# Patient Record
Sex: Female | Born: 1960 | Race: White | Hispanic: No | Marital: Married | Smoking: Never smoker
Health system: Southern US, Community
[De-identification: ages and names within clinical notes are randomized; demographics above are authoritative.]

## PROBLEM LIST (undated history)

## (undated) DIAGNOSIS — M810 Age-related osteoporosis without current pathological fracture: Secondary | ICD-10-CM

---

## 1998-01-11 ENCOUNTER — Inpatient Hospital Stay (HOSPITAL_COMMUNITY): Admission: AD | Admit: 1998-01-11 | Discharge: 1998-01-13 | Payer: Self-pay | Admitting: *Deleted

## 1998-01-13 ENCOUNTER — Encounter: Admission: RE | Admit: 1998-01-13 | Discharge: 1998-04-13 | Payer: Self-pay | Admitting: Obstetrics & Gynecology

## 1998-02-22 ENCOUNTER — Other Ambulatory Visit: Admission: RE | Admit: 1998-02-22 | Discharge: 1998-02-22 | Payer: Self-pay | Admitting: Obstetrics & Gynecology

## 2001-11-05 ENCOUNTER — Encounter: Admission: RE | Admit: 2001-11-05 | Discharge: 2001-11-05 | Payer: Self-pay | Admitting: Internal Medicine

## 2001-11-05 ENCOUNTER — Encounter: Payer: Self-pay | Admitting: Internal Medicine

## 2002-01-14 ENCOUNTER — Encounter: Admission: RE | Admit: 2002-01-14 | Discharge: 2002-01-14 | Payer: Self-pay | Admitting: Internal Medicine

## 2002-01-14 ENCOUNTER — Encounter: Payer: Self-pay | Admitting: Internal Medicine

## 2002-06-24 ENCOUNTER — Ambulatory Visit (HOSPITAL_COMMUNITY): Admission: RE | Admit: 2002-06-24 | Discharge: 2002-06-24 | Payer: Self-pay | Admitting: *Deleted

## 2003-05-18 ENCOUNTER — Other Ambulatory Visit: Admission: RE | Admit: 2003-05-18 | Discharge: 2003-05-18 | Payer: Self-pay | Admitting: Obstetrics and Gynecology

## 2003-06-28 ENCOUNTER — Encounter: Admission: RE | Admit: 2003-06-28 | Discharge: 2003-06-28 | Payer: Self-pay | Admitting: Internal Medicine

## 2004-05-24 ENCOUNTER — Other Ambulatory Visit: Admission: RE | Admit: 2004-05-24 | Discharge: 2004-05-24 | Payer: Self-pay | Admitting: Obstetrics and Gynecology

## 2004-11-13 ENCOUNTER — Emergency Department (HOSPITAL_COMMUNITY): Admission: EM | Admit: 2004-11-13 | Discharge: 2004-11-13 | Payer: Self-pay | Admitting: Emergency Medicine

## 2010-08-03 ENCOUNTER — Other Ambulatory Visit
Admission: RE | Admit: 2010-08-03 | Discharge: 2010-08-03 | Payer: Self-pay | Source: Home / Self Care | Admitting: Internal Medicine

## 2011-11-12 ENCOUNTER — Other Ambulatory Visit: Payer: Self-pay | Admitting: Obstetrics and Gynecology

## 2014-06-22 ENCOUNTER — Other Ambulatory Visit: Payer: Self-pay | Admitting: Family

## 2014-06-22 DIAGNOSIS — Z8249 Family history of ischemic heart disease and other diseases of the circulatory system: Secondary | ICD-10-CM

## 2014-07-01 ENCOUNTER — Ambulatory Visit
Admission: RE | Admit: 2014-07-01 | Discharge: 2014-07-01 | Disposition: A | Discharge status: Home / Self Care | Payer: BC Managed Care – PPO | Source: Ambulatory Visit | Attending: Family | Admitting: Family

## 2014-07-01 DIAGNOSIS — Z8249 Family history of ischemic heart disease and other diseases of the circulatory system: Secondary | ICD-10-CM

## 2015-03-16 ENCOUNTER — Other Ambulatory Visit: Payer: Self-pay | Admitting: Obstetrics and Gynecology

## 2015-03-17 LAB — CYTOLOGY - PAP

## 2015-07-05 ENCOUNTER — Other Ambulatory Visit: Payer: Self-pay | Admitting: Family

## 2015-07-05 DIAGNOSIS — E2839 Other primary ovarian failure: Secondary | ICD-10-CM

## 2015-08-09 ENCOUNTER — Ambulatory Visit
Admission: RE | Admit: 2015-08-09 | Discharge: 2015-08-09 | Disposition: A | Discharge status: Home / Self Care | Payer: BLUE CROSS/BLUE SHIELD | Source: Ambulatory Visit | Attending: Family | Admitting: Family

## 2015-08-09 DIAGNOSIS — E2839 Other primary ovarian failure: Secondary | ICD-10-CM

## 2016-06-28 DIAGNOSIS — L821 Other seborrheic keratosis: Secondary | ICD-10-CM | POA: Diagnosis not present

## 2016-06-28 DIAGNOSIS — L570 Actinic keratosis: Secondary | ICD-10-CM | POA: Diagnosis not present

## 2016-06-28 DIAGNOSIS — D2239 Melanocytic nevi of other parts of face: Secondary | ICD-10-CM | POA: Diagnosis not present

## 2016-06-28 DIAGNOSIS — D485 Neoplasm of uncertain behavior of skin: Secondary | ICD-10-CM | POA: Diagnosis not present

## 2016-07-18 DIAGNOSIS — E7800 Pure hypercholesterolemia, unspecified: Secondary | ICD-10-CM | POA: Diagnosis not present

## 2016-07-18 DIAGNOSIS — E559 Vitamin D deficiency, unspecified: Secondary | ICD-10-CM | POA: Diagnosis not present

## 2016-07-18 DIAGNOSIS — Z0000 Encounter for general adult medical examination without abnormal findings: Secondary | ICD-10-CM | POA: Diagnosis not present

## 2016-08-07 ENCOUNTER — Other Ambulatory Visit: Payer: Self-pay | Admitting: Obstetrics and Gynecology

## 2016-08-07 DIAGNOSIS — Z01419 Encounter for gynecological examination (general) (routine) without abnormal findings: Secondary | ICD-10-CM | POA: Diagnosis not present

## 2016-08-07 DIAGNOSIS — Z124 Encounter for screening for malignant neoplasm of cervix: Secondary | ICD-10-CM | POA: Diagnosis not present

## 2016-08-07 DIAGNOSIS — Z1231 Encounter for screening mammogram for malignant neoplasm of breast: Secondary | ICD-10-CM | POA: Diagnosis not present

## 2016-08-07 DIAGNOSIS — Z6821 Body mass index (BMI) 21.0-21.9, adult: Secondary | ICD-10-CM | POA: Diagnosis not present

## 2016-08-08 LAB — CYTOLOGY - PAP

## 2017-02-19 DIAGNOSIS — R399 Unspecified symptoms and signs involving the genitourinary system: Secondary | ICD-10-CM | POA: Diagnosis not present

## 2017-06-26 DIAGNOSIS — L570 Actinic keratosis: Secondary | ICD-10-CM | POA: Diagnosis not present

## 2017-06-26 DIAGNOSIS — L821 Other seborrheic keratosis: Secondary | ICD-10-CM | POA: Diagnosis not present

## 2017-07-24 DIAGNOSIS — H5210 Myopia, unspecified eye: Secondary | ICD-10-CM | POA: Diagnosis not present

## 2017-07-24 DIAGNOSIS — H2513 Age-related nuclear cataract, bilateral: Secondary | ICD-10-CM | POA: Diagnosis not present

## 2017-07-31 DIAGNOSIS — Z13820 Encounter for screening for osteoporosis: Secondary | ICD-10-CM | POA: Diagnosis not present

## 2017-07-31 DIAGNOSIS — E7800 Pure hypercholesterolemia, unspecified: Secondary | ICD-10-CM | POA: Diagnosis not present

## 2017-07-31 DIAGNOSIS — Z0000 Encounter for general adult medical examination without abnormal findings: Secondary | ICD-10-CM | POA: Diagnosis not present

## 2017-07-31 DIAGNOSIS — E559 Vitamin D deficiency, unspecified: Secondary | ICD-10-CM | POA: Diagnosis not present

## 2017-08-02 ENCOUNTER — Other Ambulatory Visit: Payer: Self-pay | Admitting: Family Medicine

## 2017-08-02 DIAGNOSIS — R5381 Other malaise: Secondary | ICD-10-CM

## 2017-08-23 DIAGNOSIS — R350 Frequency of micturition: Secondary | ICD-10-CM | POA: Diagnosis not present

## 2017-10-08 ENCOUNTER — Other Ambulatory Visit: Payer: Self-pay | Admitting: Family Medicine

## 2017-10-08 DIAGNOSIS — M8580 Other specified disorders of bone density and structure, unspecified site: Secondary | ICD-10-CM

## 2017-10-29 ENCOUNTER — Ambulatory Visit
Admission: RE | Admit: 2017-10-29 | Discharge: 2017-10-29 | Disposition: A | Discharge status: Home / Self Care | Payer: BLUE CROSS/BLUE SHIELD | Source: Ambulatory Visit | Attending: Family Medicine | Admitting: Family Medicine

## 2017-10-29 DIAGNOSIS — M8589 Other specified disorders of bone density and structure, multiple sites: Secondary | ICD-10-CM | POA: Diagnosis not present

## 2017-10-29 DIAGNOSIS — Z780 Asymptomatic menopausal state: Secondary | ICD-10-CM | POA: Diagnosis not present

## 2017-10-29 DIAGNOSIS — M8580 Other specified disorders of bone density and structure, unspecified site: Secondary | ICD-10-CM

## 2017-12-05 DIAGNOSIS — Z1231 Encounter for screening mammogram for malignant neoplasm of breast: Secondary | ICD-10-CM | POA: Diagnosis not present

## 2017-12-05 DIAGNOSIS — Z124 Encounter for screening for malignant neoplasm of cervix: Secondary | ICD-10-CM | POA: Diagnosis not present

## 2017-12-05 DIAGNOSIS — Z6821 Body mass index (BMI) 21.0-21.9, adult: Secondary | ICD-10-CM | POA: Diagnosis not present

## 2017-12-05 DIAGNOSIS — Z01419 Encounter for gynecological examination (general) (routine) without abnormal findings: Secondary | ICD-10-CM | POA: Diagnosis not present

## 2018-07-17 DIAGNOSIS — L570 Actinic keratosis: Secondary | ICD-10-CM | POA: Diagnosis not present

## 2018-09-04 DIAGNOSIS — Z0000 Encounter for general adult medical examination without abnormal findings: Secondary | ICD-10-CM | POA: Diagnosis not present

## 2018-09-04 DIAGNOSIS — E7800 Pure hypercholesterolemia, unspecified: Secondary | ICD-10-CM | POA: Diagnosis not present

## 2018-09-04 DIAGNOSIS — Z1159 Encounter for screening for other viral diseases: Secondary | ICD-10-CM | POA: Diagnosis not present

## 2018-09-04 DIAGNOSIS — Z1211 Encounter for screening for malignant neoplasm of colon: Secondary | ICD-10-CM | POA: Diagnosis not present

## 2018-09-04 DIAGNOSIS — E559 Vitamin D deficiency, unspecified: Secondary | ICD-10-CM | POA: Diagnosis not present

## 2018-09-29 DIAGNOSIS — M9904 Segmental and somatic dysfunction of sacral region: Secondary | ICD-10-CM | POA: Diagnosis not present

## 2018-09-29 DIAGNOSIS — M62830 Muscle spasm of back: Secondary | ICD-10-CM | POA: Diagnosis not present

## 2018-09-29 DIAGNOSIS — M9903 Segmental and somatic dysfunction of lumbar region: Secondary | ICD-10-CM | POA: Diagnosis not present

## 2018-09-29 DIAGNOSIS — M545 Low back pain: Secondary | ICD-10-CM | POA: Diagnosis not present

## 2018-09-30 DIAGNOSIS — M62830 Muscle spasm of back: Secondary | ICD-10-CM | POA: Diagnosis not present

## 2018-09-30 DIAGNOSIS — M545 Low back pain: Secondary | ICD-10-CM | POA: Diagnosis not present

## 2018-09-30 DIAGNOSIS — M9904 Segmental and somatic dysfunction of sacral region: Secondary | ICD-10-CM | POA: Diagnosis not present

## 2018-09-30 DIAGNOSIS — M9903 Segmental and somatic dysfunction of lumbar region: Secondary | ICD-10-CM | POA: Diagnosis not present

## 2018-10-01 DIAGNOSIS — M545 Low back pain: Secondary | ICD-10-CM | POA: Diagnosis not present

## 2018-10-01 DIAGNOSIS — M9904 Segmental and somatic dysfunction of sacral region: Secondary | ICD-10-CM | POA: Diagnosis not present

## 2018-10-01 DIAGNOSIS — M9903 Segmental and somatic dysfunction of lumbar region: Secondary | ICD-10-CM | POA: Diagnosis not present

## 2018-10-01 DIAGNOSIS — M62830 Muscle spasm of back: Secondary | ICD-10-CM | POA: Diagnosis not present

## 2018-10-06 DIAGNOSIS — M62830 Muscle spasm of back: Secondary | ICD-10-CM | POA: Diagnosis not present

## 2018-10-06 DIAGNOSIS — M545 Low back pain: Secondary | ICD-10-CM | POA: Diagnosis not present

## 2018-10-06 DIAGNOSIS — M9904 Segmental and somatic dysfunction of sacral region: Secondary | ICD-10-CM | POA: Diagnosis not present

## 2018-10-06 DIAGNOSIS — M9903 Segmental and somatic dysfunction of lumbar region: Secondary | ICD-10-CM | POA: Diagnosis not present

## 2018-10-08 DIAGNOSIS — M545 Low back pain: Secondary | ICD-10-CM | POA: Diagnosis not present

## 2018-10-08 DIAGNOSIS — M9904 Segmental and somatic dysfunction of sacral region: Secondary | ICD-10-CM | POA: Diagnosis not present

## 2018-10-08 DIAGNOSIS — M62830 Muscle spasm of back: Secondary | ICD-10-CM | POA: Diagnosis not present

## 2018-10-08 DIAGNOSIS — M9903 Segmental and somatic dysfunction of lumbar region: Secondary | ICD-10-CM | POA: Diagnosis not present

## 2018-10-13 DIAGNOSIS — M9903 Segmental and somatic dysfunction of lumbar region: Secondary | ICD-10-CM | POA: Diagnosis not present

## 2018-10-13 DIAGNOSIS — M62830 Muscle spasm of back: Secondary | ICD-10-CM | POA: Diagnosis not present

## 2018-10-13 DIAGNOSIS — M9904 Segmental and somatic dysfunction of sacral region: Secondary | ICD-10-CM | POA: Diagnosis not present

## 2018-10-13 DIAGNOSIS — M545 Low back pain: Secondary | ICD-10-CM | POA: Diagnosis not present

## 2018-10-15 DIAGNOSIS — M9904 Segmental and somatic dysfunction of sacral region: Secondary | ICD-10-CM | POA: Diagnosis not present

## 2018-10-15 DIAGNOSIS — M62830 Muscle spasm of back: Secondary | ICD-10-CM | POA: Diagnosis not present

## 2018-10-15 DIAGNOSIS — M9903 Segmental and somatic dysfunction of lumbar region: Secondary | ICD-10-CM | POA: Diagnosis not present

## 2018-10-15 DIAGNOSIS — M545 Low back pain: Secondary | ICD-10-CM | POA: Diagnosis not present

## 2018-10-20 DIAGNOSIS — M9903 Segmental and somatic dysfunction of lumbar region: Secondary | ICD-10-CM | POA: Diagnosis not present

## 2018-10-20 DIAGNOSIS — M545 Low back pain: Secondary | ICD-10-CM | POA: Diagnosis not present

## 2018-10-20 DIAGNOSIS — M9904 Segmental and somatic dysfunction of sacral region: Secondary | ICD-10-CM | POA: Diagnosis not present

## 2018-10-20 DIAGNOSIS — M62830 Muscle spasm of back: Secondary | ICD-10-CM | POA: Diagnosis not present

## 2018-10-22 DIAGNOSIS — M9903 Segmental and somatic dysfunction of lumbar region: Secondary | ICD-10-CM | POA: Diagnosis not present

## 2018-10-22 DIAGNOSIS — M545 Low back pain: Secondary | ICD-10-CM | POA: Diagnosis not present

## 2018-10-22 DIAGNOSIS — M9904 Segmental and somatic dysfunction of sacral region: Secondary | ICD-10-CM | POA: Diagnosis not present

## 2018-10-22 DIAGNOSIS — M62830 Muscle spasm of back: Secondary | ICD-10-CM | POA: Diagnosis not present

## 2018-10-27 DIAGNOSIS — M9904 Segmental and somatic dysfunction of sacral region: Secondary | ICD-10-CM | POA: Diagnosis not present

## 2018-10-27 DIAGNOSIS — M9903 Segmental and somatic dysfunction of lumbar region: Secondary | ICD-10-CM | POA: Diagnosis not present

## 2018-10-27 DIAGNOSIS — M62830 Muscle spasm of back: Secondary | ICD-10-CM | POA: Diagnosis not present

## 2018-10-27 DIAGNOSIS — M545 Low back pain: Secondary | ICD-10-CM | POA: Diagnosis not present

## 2018-10-30 DIAGNOSIS — M9904 Segmental and somatic dysfunction of sacral region: Secondary | ICD-10-CM | POA: Diagnosis not present

## 2018-10-30 DIAGNOSIS — M545 Low back pain: Secondary | ICD-10-CM | POA: Diagnosis not present

## 2018-10-30 DIAGNOSIS — M9903 Segmental and somatic dysfunction of lumbar region: Secondary | ICD-10-CM | POA: Diagnosis not present

## 2018-10-30 DIAGNOSIS — M62830 Muscle spasm of back: Secondary | ICD-10-CM | POA: Diagnosis not present

## 2018-11-03 DIAGNOSIS — M545 Low back pain: Secondary | ICD-10-CM | POA: Diagnosis not present

## 2018-11-03 DIAGNOSIS — M9904 Segmental and somatic dysfunction of sacral region: Secondary | ICD-10-CM | POA: Diagnosis not present

## 2018-11-03 DIAGNOSIS — M9903 Segmental and somatic dysfunction of lumbar region: Secondary | ICD-10-CM | POA: Diagnosis not present

## 2018-11-03 DIAGNOSIS — M62830 Muscle spasm of back: Secondary | ICD-10-CM | POA: Diagnosis not present

## 2018-11-04 DIAGNOSIS — Z578 Occupational exposure to other risk factors: Secondary | ICD-10-CM | POA: Diagnosis not present

## 2018-11-05 DIAGNOSIS — H43391 Other vitreous opacities, right eye: Secondary | ICD-10-CM | POA: Diagnosis not present

## 2018-11-10 DIAGNOSIS — M9903 Segmental and somatic dysfunction of lumbar region: Secondary | ICD-10-CM | POA: Diagnosis not present

## 2018-11-10 DIAGNOSIS — M62830 Muscle spasm of back: Secondary | ICD-10-CM | POA: Diagnosis not present

## 2018-11-10 DIAGNOSIS — M9904 Segmental and somatic dysfunction of sacral region: Secondary | ICD-10-CM | POA: Diagnosis not present

## 2018-11-10 DIAGNOSIS — M545 Low back pain: Secondary | ICD-10-CM | POA: Diagnosis not present

## 2018-11-17 DIAGNOSIS — M62830 Muscle spasm of back: Secondary | ICD-10-CM | POA: Diagnosis not present

## 2018-11-17 DIAGNOSIS — M545 Low back pain: Secondary | ICD-10-CM | POA: Diagnosis not present

## 2018-11-17 DIAGNOSIS — M9903 Segmental and somatic dysfunction of lumbar region: Secondary | ICD-10-CM | POA: Diagnosis not present

## 2018-11-17 DIAGNOSIS — M9904 Segmental and somatic dysfunction of sacral region: Secondary | ICD-10-CM | POA: Diagnosis not present

## 2018-12-01 DIAGNOSIS — M9904 Segmental and somatic dysfunction of sacral region: Secondary | ICD-10-CM | POA: Diagnosis not present

## 2018-12-01 DIAGNOSIS — M62830 Muscle spasm of back: Secondary | ICD-10-CM | POA: Diagnosis not present

## 2018-12-01 DIAGNOSIS — M545 Low back pain: Secondary | ICD-10-CM | POA: Diagnosis not present

## 2018-12-01 DIAGNOSIS — M9903 Segmental and somatic dysfunction of lumbar region: Secondary | ICD-10-CM | POA: Diagnosis not present

## 2018-12-09 DIAGNOSIS — E7800 Pure hypercholesterolemia, unspecified: Secondary | ICD-10-CM | POA: Diagnosis not present

## 2018-12-15 DIAGNOSIS — M62830 Muscle spasm of back: Secondary | ICD-10-CM | POA: Diagnosis not present

## 2018-12-15 DIAGNOSIS — M9904 Segmental and somatic dysfunction of sacral region: Secondary | ICD-10-CM | POA: Diagnosis not present

## 2018-12-15 DIAGNOSIS — M9903 Segmental and somatic dysfunction of lumbar region: Secondary | ICD-10-CM | POA: Diagnosis not present

## 2018-12-15 DIAGNOSIS — M545 Low back pain: Secondary | ICD-10-CM | POA: Diagnosis not present

## 2018-12-29 DIAGNOSIS — M545 Low back pain: Secondary | ICD-10-CM | POA: Diagnosis not present

## 2018-12-29 DIAGNOSIS — M9904 Segmental and somatic dysfunction of sacral region: Secondary | ICD-10-CM | POA: Diagnosis not present

## 2018-12-29 DIAGNOSIS — M62830 Muscle spasm of back: Secondary | ICD-10-CM | POA: Diagnosis not present

## 2018-12-29 DIAGNOSIS — M9903 Segmental and somatic dysfunction of lumbar region: Secondary | ICD-10-CM | POA: Diagnosis not present

## 2019-01-20 DIAGNOSIS — M9904 Segmental and somatic dysfunction of sacral region: Secondary | ICD-10-CM | POA: Diagnosis not present

## 2019-01-20 DIAGNOSIS — M62830 Muscle spasm of back: Secondary | ICD-10-CM | POA: Diagnosis not present

## 2019-01-20 DIAGNOSIS — M545 Low back pain: Secondary | ICD-10-CM | POA: Diagnosis not present

## 2019-01-20 DIAGNOSIS — M9903 Segmental and somatic dysfunction of lumbar region: Secondary | ICD-10-CM | POA: Diagnosis not present

## 2019-02-25 DIAGNOSIS — M9904 Segmental and somatic dysfunction of sacral region: Secondary | ICD-10-CM | POA: Diagnosis not present

## 2019-02-25 DIAGNOSIS — M62830 Muscle spasm of back: Secondary | ICD-10-CM | POA: Diagnosis not present

## 2019-02-25 DIAGNOSIS — M545 Low back pain: Secondary | ICD-10-CM | POA: Diagnosis not present

## 2019-02-25 DIAGNOSIS — M9903 Segmental and somatic dysfunction of lumbar region: Secondary | ICD-10-CM | POA: Diagnosis not present

## 2019-03-24 DIAGNOSIS — L578 Other skin changes due to chronic exposure to nonionizing radiation: Secondary | ICD-10-CM | POA: Diagnosis not present

## 2019-03-24 DIAGNOSIS — L821 Other seborrheic keratosis: Secondary | ICD-10-CM | POA: Diagnosis not present

## 2019-03-24 DIAGNOSIS — Z85828 Personal history of other malignant neoplasm of skin: Secondary | ICD-10-CM | POA: Diagnosis not present

## 2019-03-24 DIAGNOSIS — D225 Melanocytic nevi of trunk: Secondary | ICD-10-CM | POA: Diagnosis not present

## 2019-03-25 DIAGNOSIS — M62830 Muscle spasm of back: Secondary | ICD-10-CM | POA: Diagnosis not present

## 2019-03-25 DIAGNOSIS — M545 Low back pain: Secondary | ICD-10-CM | POA: Diagnosis not present

## 2019-03-25 DIAGNOSIS — M9904 Segmental and somatic dysfunction of sacral region: Secondary | ICD-10-CM | POA: Diagnosis not present

## 2019-03-25 DIAGNOSIS — M9903 Segmental and somatic dysfunction of lumbar region: Secondary | ICD-10-CM | POA: Diagnosis not present

## 2019-04-10 DIAGNOSIS — Z1231 Encounter for screening mammogram for malignant neoplasm of breast: Secondary | ICD-10-CM | POA: Diagnosis not present

## 2019-04-10 DIAGNOSIS — Z01419 Encounter for gynecological examination (general) (routine) without abnormal findings: Secondary | ICD-10-CM | POA: Diagnosis not present

## 2019-04-10 DIAGNOSIS — Z6821 Body mass index (BMI) 21.0-21.9, adult: Secondary | ICD-10-CM | POA: Diagnosis not present

## 2019-04-10 DIAGNOSIS — Z124 Encounter for screening for malignant neoplasm of cervix: Secondary | ICD-10-CM | POA: Diagnosis not present

## 2019-04-22 DIAGNOSIS — M62830 Muscle spasm of back: Secondary | ICD-10-CM | POA: Diagnosis not present

## 2019-04-22 DIAGNOSIS — M9904 Segmental and somatic dysfunction of sacral region: Secondary | ICD-10-CM | POA: Diagnosis not present

## 2019-04-22 DIAGNOSIS — M9903 Segmental and somatic dysfunction of lumbar region: Secondary | ICD-10-CM | POA: Diagnosis not present

## 2019-04-22 DIAGNOSIS — M545 Low back pain: Secondary | ICD-10-CM | POA: Diagnosis not present

## 2019-05-20 DIAGNOSIS — M9904 Segmental and somatic dysfunction of sacral region: Secondary | ICD-10-CM | POA: Diagnosis not present

## 2019-05-20 DIAGNOSIS — M62830 Muscle spasm of back: Secondary | ICD-10-CM | POA: Diagnosis not present

## 2019-05-20 DIAGNOSIS — M9903 Segmental and somatic dysfunction of lumbar region: Secondary | ICD-10-CM | POA: Diagnosis not present

## 2019-05-20 DIAGNOSIS — M545 Low back pain: Secondary | ICD-10-CM | POA: Diagnosis not present

## 2019-06-17 DIAGNOSIS — M9903 Segmental and somatic dysfunction of lumbar region: Secondary | ICD-10-CM | POA: Diagnosis not present

## 2019-06-17 DIAGNOSIS — M545 Low back pain: Secondary | ICD-10-CM | POA: Diagnosis not present

## 2019-06-17 DIAGNOSIS — M9904 Segmental and somatic dysfunction of sacral region: Secondary | ICD-10-CM | POA: Diagnosis not present

## 2019-06-17 DIAGNOSIS — M62830 Muscle spasm of back: Secondary | ICD-10-CM | POA: Diagnosis not present

## 2019-07-15 DIAGNOSIS — M62830 Muscle spasm of back: Secondary | ICD-10-CM | POA: Diagnosis not present

## 2019-07-15 DIAGNOSIS — M9903 Segmental and somatic dysfunction of lumbar region: Secondary | ICD-10-CM | POA: Diagnosis not present

## 2019-07-15 DIAGNOSIS — M9904 Segmental and somatic dysfunction of sacral region: Secondary | ICD-10-CM | POA: Diagnosis not present

## 2019-07-15 DIAGNOSIS — M545 Low back pain: Secondary | ICD-10-CM | POA: Diagnosis not present

## 2019-07-17 DIAGNOSIS — Z20828 Contact with and (suspected) exposure to other viral communicable diseases: Secondary | ICD-10-CM | POA: Diagnosis not present

## 2019-09-09 DIAGNOSIS — M9903 Segmental and somatic dysfunction of lumbar region: Secondary | ICD-10-CM | POA: Diagnosis not present

## 2019-09-09 DIAGNOSIS — M62830 Muscle spasm of back: Secondary | ICD-10-CM | POA: Diagnosis not present

## 2019-09-09 DIAGNOSIS — M545 Low back pain: Secondary | ICD-10-CM | POA: Diagnosis not present

## 2019-09-09 DIAGNOSIS — M9904 Segmental and somatic dysfunction of sacral region: Secondary | ICD-10-CM | POA: Diagnosis not present

## 2019-09-10 DIAGNOSIS — E7800 Pure hypercholesterolemia, unspecified: Secondary | ICD-10-CM | POA: Diagnosis not present

## 2019-10-07 DIAGNOSIS — M545 Low back pain: Secondary | ICD-10-CM | POA: Diagnosis not present

## 2019-10-07 DIAGNOSIS — M62830 Muscle spasm of back: Secondary | ICD-10-CM | POA: Diagnosis not present

## 2019-10-07 DIAGNOSIS — M9903 Segmental and somatic dysfunction of lumbar region: Secondary | ICD-10-CM | POA: Diagnosis not present

## 2019-10-07 DIAGNOSIS — M9904 Segmental and somatic dysfunction of sacral region: Secondary | ICD-10-CM | POA: Diagnosis not present

## 2019-10-22 ENCOUNTER — Encounter (HOSPITAL_COMMUNITY): Payer: Self-pay

## 2019-10-22 ENCOUNTER — Emergency Department (HOSPITAL_COMMUNITY)
Admission: EM | Admit: 2019-10-22 | Discharge: 2019-10-22 | Disposition: A | Discharge status: Home / Self Care | Payer: BC Managed Care – PPO | Attending: Emergency Medicine | Admitting: Emergency Medicine

## 2019-10-22 ENCOUNTER — Other Ambulatory Visit: Payer: Self-pay

## 2019-10-22 ENCOUNTER — Emergency Department (HOSPITAL_COMMUNITY): Payer: BC Managed Care – PPO

## 2019-10-22 DIAGNOSIS — R0789 Other chest pain: Secondary | ICD-10-CM | POA: Diagnosis not present

## 2019-10-22 DIAGNOSIS — R079 Chest pain, unspecified: Secondary | ICD-10-CM | POA: Diagnosis not present

## 2019-10-22 LAB — CBC WITH DIFFERENTIAL/PLATELET
Abs Immature Granulocytes: 0.01 10*3/uL (ref 0.00–0.07)
Basophils Absolute: 0 10*3/uL (ref 0.0–0.1)
Basophils Relative: 1 %
Eosinophils Absolute: 0.1 10*3/uL (ref 0.0–0.5)
Eosinophils Relative: 1 %
HCT: 40 % (ref 36.0–46.0)
Hemoglobin: 13.3 g/dL (ref 12.0–15.0)
Immature Granulocytes: 0 %
Lymphocytes Relative: 30 %
Lymphs Abs: 1.2 10*3/uL (ref 0.7–4.0)
MCH: 31.6 pg (ref 26.0–34.0)
MCHC: 33.3 g/dL (ref 30.0–36.0)
MCV: 95 fL (ref 80.0–100.0)
Monocytes Absolute: 0.5 10*3/uL (ref 0.1–1.0)
Monocytes Relative: 11 %
Neutro Abs: 2.3 10*3/uL (ref 1.7–7.7)
Neutrophils Relative %: 57 %
Platelets: 214 10*3/uL (ref 150–400)
RBC: 4.21 MIL/uL (ref 3.87–5.11)
RDW: 13 % (ref 11.5–15.5)
WBC: 4.1 10*3/uL (ref 4.0–10.5)
nRBC: 0 % (ref 0.0–0.2)

## 2019-10-22 LAB — BASIC METABOLIC PANEL
Anion gap: 8 (ref 5–15)
BUN: 18 mg/dL (ref 6–20)
CO2: 26 mmol/L (ref 22–32)
Calcium: 9.2 mg/dL (ref 8.9–10.3)
Chloride: 104 mmol/L (ref 98–111)
Creatinine, Ser: 0.54 mg/dL (ref 0.44–1.00)
GFR calc Af Amer: >60 mL/min (ref >60)
GFR calc non Af Amer: >60 mL/min (ref >60)
Glucose, Bld: 104 mg/dL — ABNORMAL HIGH (ref 70–99)
Potassium: 3.8 mmol/L (ref 3.5–5.1)
Sodium: 138 mmol/L (ref 135–145)

## 2019-10-22 LAB — TROPONIN I (HIGH SENSITIVITY)
Troponin I (High Sensitivity): <2 ng/L (ref <18)
Troponin I (High Sensitivity): 2 ng/L (ref <18)

## 2019-10-22 LAB — I-STAT BETA HCG BLOOD, ED (MC, WL, AP ONLY): I-stat hCG, quantitative: <5 m[IU]/mL (ref <5)

## 2019-10-22 MED ORDER — IBUPROFEN 200 MG PO TABS
400.0000 mg | ORAL_TABLET | Freq: Once | ORAL | Status: AC
  Administered 2019-10-22: 400 mg via ORAL
  Filled 2019-10-22: qty 2

## 2019-10-22 MED ORDER — ASPIRIN 81 MG PO CHEW
324.0000 mg | CHEWABLE_TABLET | Freq: Once | ORAL | Status: AC
  Administered 2019-10-22: 324 mg via ORAL
  Filled 2019-10-22: qty 4

## 2019-10-22 MED ORDER — SODIUM CHLORIDE 0.9% FLUSH
3.0000 mL | Freq: Once | INTRAVENOUS | Status: AC
  Administered 2019-10-22: 3 mL via INTRAVENOUS

## 2019-10-22 NOTE — ED Triage Notes (Signed)
Pt reports L sided chest pain since about 4a. Also endorsing nausea and lightheadedness. She denies radiation to arm, neck or jaw. Denies vomiting. A&Ox4.

## 2019-10-22 NOTE — ED Provider Notes (Signed)
  Physical Exam  BP 129/65 (BP Location: Right Arm)   Pulse 66   Temp 98 F (36.7 C) (Oral)   Resp 16   Ht 1.651 m (5\' 5" )   Wt 56.7 kg   SpO2 100%   BMI 20.80 kg/m   Physical Exam  ED Course/Procedures     Procedures  MDM  Care received from Dr. at 073 5 AM.  Patient awaiting delta troponin.  Heart score 2.  Plan discharge to home      Preston Fleeting, MD 10/22/19 [phone]

## 2019-10-22 NOTE — ED Provider Notes (Signed)
[facility] DEPT Provider Note   CSN: [id number] Arrival date & time: 10/22/19  9242   History Chief Complaint  Patient presents with  . Chest Pain    Michele Ross is a 59 y.o. female.  The history is provided by the patient.  Chest Pain She onset about 4 AM sharp left inframammary pain which subsided.  She went back to sleep and woke up again about 5AM with similar pain but now dull.  It is slightly worse with a deep breath.  There is no associated dyspnea or nausea but she did have some mild diaphoresis.  She checked her heart beat and noted that it was faster than normal but did not actually count the heart rate.  Pain was 5/10 at its worst, now is down to 2/10.  She has not taken anything to treat the pain.  She does not recall similar episodes before.  She is a non-smoker and denies history of hypertension or diabetes.  There is a history of hyperlipidemia.  There is no family history of premature coronary atherosclerosis.  She denies recent travel and denies recent surgery.  There is no history of DVT or pulmonary embolism.  History reviewed. No pertinent past medical history.  There are no problems to display for this patient.   ** The histories are not reviewed yet. Please review them in the "History" navigator section and refresh this [place].   OB History   No obstetric history on file.     History reviewed. No pertinent family history.  Social History   Tobacco Use  . Smoking status: Not on file  Substance Use Topics  . Alcohol use: Not on file  . Drug use: Not on file    Home Medications Prior to Admission medications   Not on File    Allergies    Patient has no known allergies.  Review of Systems   Review of Systems  Cardiovascular: Positive for chest pain.  All other systems reviewed and are negative.   Physical Exam Updated Vital Signs BP (!) 144/86 (BP Location: Right Arm)   Pulse 90   Temp 97.9 F (36.6  C) (Oral)   Resp 17   Ht 5\' 5"  (1.651 m)   Wt 56.7 kg   SpO2 99%   BMI 20.80 kg/m   Physical Exam Vitals and nursing note reviewed.   59 year old female, resting comfortably and in no acute distress. Vital signs are significant for mildly elevated blood pressure. Oxygen saturation is 99%, which is normal. Head is normocephalic and atraumatic. PERRLA, EOMI. Oropharynx is clear. Neck is nontender and supple without adenopathy or JVD. Back is nontender and there is no CVA tenderness. Lungs are clear without rales, wheezes, or rhonchi. Chest is nontender. Heart has regular rate and rhythm without murmur. Abdomen is soft, flat, nontender without masses or hepatosplenomegaly and peristalsis is normoactive. Extremities have no cyanosis or edema, full range of motion is present. Skin is warm and dry without rash. Neurologic: Mental status is normal, cranial nerves are intact, there are no motor or sensory deficits.  ED Results / Procedures / Treatments   Labs (all labs ordered are listed, but only abnormal results are displayed) Labs Reviewed  BASIC METABOLIC PANEL - Abnormal; Notable for the following components:      Result Value   Glucose, Bld 104 (*)    All other components within normal limits  CBC WITH DIFFERENTIAL/PLATELET  I-STAT BETA HCG BLOOD, ED ([facility], [facility],  [facility] ONLY)  TROPONIN I (HIGH SENSITIVITY)  TROPONIN I (HIGH SENSITIVITY)    EKG EKG Interpretation  Date/Time:  Thursday October 22 2019 05:51:46 EST Ventricular Rate:  80 PR Interval:    QRS Duration: 97 QT Interval:  371 QTC Calculation: 428 R Axis:   54 Text Interpretation: Sinus rhythm Low voltage, precordial leads No old tracing to compare Confirmed by Dione Booze ([id number]) on 10/22/2019 5:55:18 AM   Radiology DG Chest 2 View  Result Date: 10/22/2019 CLINICAL DATA:  Woke with chest pain, sudden onset of mid chest pain EXAM: CHEST - 2 VIEW COMPARISON:  None. FINDINGS: No consolidation, features of edema,  pneumothorax, or effusion. Pulmonary vascularity is normally distributed. The cardiomediastinal contours are unremarkable. No acute osseous or soft tissue abnormality. IMPRESSION: No acute cardiopulmonary abnormality. Electronically Signed   By: Kreg Shropshire M.D.   On: 10/22/2019 06:11    Procedures Procedures   Medications Ordered in ED Medications  sodium chloride flush (NS) 0.9 % injection 3 mL (has no administration in time range)    ED Course  I have reviewed the triage vital signs and the nursing notes.  Pertinent labs & imaging results that were available during my care of the patient were reviewed by me and considered in my medical decision making (see chart for details).  MDM Rules/Calculators/A&P Atypical chest pain.  Heart score is 2 which puts her at low risk for major adverse cardiac events in the next 6 weeks.  By Anner Crete criteria for pulmonary embolism, she is at low risk, so I do not need to screen with D-dimer.  ECG is normal.  Chest x-ray is normal.  Will check troponin.  She is given a dose of aspirin and ibuprofen.  Old records are reviewed, and she has no relevant past visits.  She had complete relief of pain with above-noted treatment.  Initial labs are normal including normal troponin.  She is being held in the emergency department for delta troponin.  Case is signed out to Dr. Rosalia Hammers.  Final Clinical Impression(s) / ED Diagnoses Final diagnoses:  Atypical chest pain   Rx / DC Orders ED Discharge Orders    None       Dione Booze, MD 10/22/19 [phone]

## 2019-10-22 NOTE — Discharge Instructions (Addendum)
Your heart enzymes are normal.  Please call your doctor to discuss outpatient management. Return if you have worsening pain, shortness of breath, or new symptoms

## 2019-11-04 DIAGNOSIS — M9903 Segmental and somatic dysfunction of lumbar region: Secondary | ICD-10-CM | POA: Diagnosis not present

## 2019-11-04 DIAGNOSIS — M545 Low back pain: Secondary | ICD-10-CM | POA: Diagnosis not present

## 2019-11-04 DIAGNOSIS — M62830 Muscle spasm of back: Secondary | ICD-10-CM | POA: Diagnosis not present

## 2019-11-04 DIAGNOSIS — M9904 Segmental and somatic dysfunction of sacral region: Secondary | ICD-10-CM | POA: Diagnosis not present

## 2019-12-15 DIAGNOSIS — M79671 Pain in right foot: Secondary | ICD-10-CM | POA: Diagnosis not present

## 2019-12-15 DIAGNOSIS — M792 Neuralgia and neuritis, unspecified: Secondary | ICD-10-CM | POA: Diagnosis not present

## 2019-12-15 DIAGNOSIS — M21612 Bunion of left foot: Secondary | ICD-10-CM | POA: Diagnosis not present

## 2019-12-15 DIAGNOSIS — M21621 Bunionette of right foot: Secondary | ICD-10-CM | POA: Diagnosis not present

## 2019-12-15 DIAGNOSIS — M21611 Bunion of right foot: Secondary | ICD-10-CM | POA: Diagnosis not present

## 2019-12-16 DIAGNOSIS — Z0000 Encounter for general adult medical examination without abnormal findings: Secondary | ICD-10-CM | POA: Diagnosis not present

## 2019-12-16 DIAGNOSIS — M62830 Muscle spasm of back: Secondary | ICD-10-CM | POA: Diagnosis not present

## 2019-12-16 DIAGNOSIS — M9904 Segmental and somatic dysfunction of sacral region: Secondary | ICD-10-CM | POA: Diagnosis not present

## 2019-12-16 DIAGNOSIS — M9903 Segmental and somatic dysfunction of lumbar region: Secondary | ICD-10-CM | POA: Diagnosis not present

## 2019-12-16 DIAGNOSIS — M545 Low back pain: Secondary | ICD-10-CM | POA: Diagnosis not present

## 2019-12-16 DIAGNOSIS — Z23 Encounter for immunization: Secondary | ICD-10-CM | POA: Diagnosis not present

## 2020-01-21 DIAGNOSIS — M792 Neuralgia and neuritis, unspecified: Secondary | ICD-10-CM | POA: Diagnosis not present

## 2020-01-21 DIAGNOSIS — M79671 Pain in right foot: Secondary | ICD-10-CM | POA: Diagnosis not present

## 2020-01-21 DIAGNOSIS — M21612 Bunion of left foot: Secondary | ICD-10-CM | POA: Diagnosis not present

## 2020-01-21 DIAGNOSIS — M21611 Bunion of right foot: Secondary | ICD-10-CM | POA: Diagnosis not present

## 2020-01-21 DIAGNOSIS — M21621 Bunionette of right foot: Secondary | ICD-10-CM | POA: Diagnosis not present

## 2020-02-10 DIAGNOSIS — M9904 Segmental and somatic dysfunction of sacral region: Secondary | ICD-10-CM | POA: Diagnosis not present

## 2020-02-10 DIAGNOSIS — M62830 Muscle spasm of back: Secondary | ICD-10-CM | POA: Diagnosis not present

## 2020-02-10 DIAGNOSIS — M9903 Segmental and somatic dysfunction of lumbar region: Secondary | ICD-10-CM | POA: Diagnosis not present

## 2020-02-10 DIAGNOSIS — M545 Low back pain: Secondary | ICD-10-CM | POA: Diagnosis not present

## 2020-02-25 DIAGNOSIS — M21621 Bunionette of right foot: Secondary | ICD-10-CM | POA: Diagnosis not present

## 2020-02-25 DIAGNOSIS — M21611 Bunion of right foot: Secondary | ICD-10-CM | POA: Diagnosis not present

## 2020-02-25 DIAGNOSIS — M792 Neuralgia and neuritis, unspecified: Secondary | ICD-10-CM | POA: Diagnosis not present

## 2020-02-25 DIAGNOSIS — M79671 Pain in right foot: Secondary | ICD-10-CM | POA: Diagnosis not present

## 2020-03-23 DIAGNOSIS — M62830 Muscle spasm of back: Secondary | ICD-10-CM | POA: Diagnosis not present

## 2020-03-23 DIAGNOSIS — M9903 Segmental and somatic dysfunction of lumbar region: Secondary | ICD-10-CM | POA: Diagnosis not present

## 2020-03-23 DIAGNOSIS — M9904 Segmental and somatic dysfunction of sacral region: Secondary | ICD-10-CM | POA: Diagnosis not present

## 2020-03-23 DIAGNOSIS — M545 Low back pain: Secondary | ICD-10-CM | POA: Diagnosis not present

## 2020-03-29 DIAGNOSIS — R05 Cough: Secondary | ICD-10-CM | POA: Diagnosis not present

## 2020-03-29 DIAGNOSIS — J3489 Other specified disorders of nose and nasal sinuses: Secondary | ICD-10-CM | POA: Diagnosis not present

## 2020-05-04 DIAGNOSIS — M545 Low back pain: Secondary | ICD-10-CM | POA: Diagnosis not present

## 2020-05-04 DIAGNOSIS — M9903 Segmental and somatic dysfunction of lumbar region: Secondary | ICD-10-CM | POA: Diagnosis not present

## 2020-05-04 DIAGNOSIS — M62830 Muscle spasm of back: Secondary | ICD-10-CM | POA: Diagnosis not present

## 2020-05-04 DIAGNOSIS — M9904 Segmental and somatic dysfunction of sacral region: Secondary | ICD-10-CM | POA: Diagnosis not present

## 2020-05-09 DIAGNOSIS — Z20828 Contact with and (suspected) exposure to other viral communicable diseases: Secondary | ICD-10-CM | POA: Diagnosis not present

## 2020-05-16 DIAGNOSIS — Z20822 Contact with and (suspected) exposure to covid-19: Secondary | ICD-10-CM | POA: Diagnosis not present

## 2020-05-19 DIAGNOSIS — Z1231 Encounter for screening mammogram for malignant neoplasm of breast: Secondary | ICD-10-CM | POA: Diagnosis not present

## 2020-05-19 DIAGNOSIS — Z124 Encounter for screening for malignant neoplasm of cervix: Secondary | ICD-10-CM | POA: Diagnosis not present

## 2020-05-19 DIAGNOSIS — Z01419 Encounter for gynecological examination (general) (routine) without abnormal findings: Secondary | ICD-10-CM | POA: Diagnosis not present

## 2020-07-05 DIAGNOSIS — M62830 Muscle spasm of back: Secondary | ICD-10-CM | POA: Diagnosis not present

## 2020-07-05 DIAGNOSIS — M9901 Segmental and somatic dysfunction of cervical region: Secondary | ICD-10-CM | POA: Diagnosis not present

## 2020-07-05 DIAGNOSIS — M9903 Segmental and somatic dysfunction of lumbar region: Secondary | ICD-10-CM | POA: Diagnosis not present

## 2020-07-05 DIAGNOSIS — M9904 Segmental and somatic dysfunction of sacral region: Secondary | ICD-10-CM | POA: Diagnosis not present

## 2020-07-11 DIAGNOSIS — H04123 Dry eye syndrome of bilateral lacrimal glands: Secondary | ICD-10-CM | POA: Diagnosis not present

## 2020-07-11 DIAGNOSIS — H524 Presbyopia: Secondary | ICD-10-CM | POA: Diagnosis not present

## 2020-07-11 DIAGNOSIS — H25013 Cortical age-related cataract, bilateral: Secondary | ICD-10-CM | POA: Diagnosis not present

## 2020-07-11 DIAGNOSIS — H43391 Other vitreous opacities, right eye: Secondary | ICD-10-CM | POA: Diagnosis not present

## 2020-07-11 DIAGNOSIS — H2513 Age-related nuclear cataract, bilateral: Secondary | ICD-10-CM | POA: Diagnosis not present

## 2020-08-17 DIAGNOSIS — S336XXA Sprain of sacroiliac joint, initial encounter: Secondary | ICD-10-CM | POA: Diagnosis not present

## 2020-08-17 DIAGNOSIS — M62830 Muscle spasm of back: Secondary | ICD-10-CM | POA: Diagnosis not present

## 2020-08-17 DIAGNOSIS — M9903 Segmental and somatic dysfunction of lumbar region: Secondary | ICD-10-CM | POA: Diagnosis not present

## 2020-08-17 DIAGNOSIS — M9904 Segmental and somatic dysfunction of sacral region: Secondary | ICD-10-CM | POA: Diagnosis not present

## 2020-09-02 DIAGNOSIS — H04123 Dry eye syndrome of bilateral lacrimal glands: Secondary | ICD-10-CM | POA: Diagnosis not present

## 2020-09-28 DIAGNOSIS — M9903 Segmental and somatic dysfunction of lumbar region: Secondary | ICD-10-CM | POA: Diagnosis not present

## 2020-09-28 DIAGNOSIS — M9904 Segmental and somatic dysfunction of sacral region: Secondary | ICD-10-CM | POA: Diagnosis not present

## 2020-09-28 DIAGNOSIS — M62830 Muscle spasm of back: Secondary | ICD-10-CM | POA: Diagnosis not present

## 2020-09-28 DIAGNOSIS — S336XXA Sprain of sacroiliac joint, initial encounter: Secondary | ICD-10-CM | POA: Diagnosis not present

## 2020-11-02 DIAGNOSIS — M62830 Muscle spasm of back: Secondary | ICD-10-CM | POA: Diagnosis not present

## 2020-11-02 DIAGNOSIS — M9903 Segmental and somatic dysfunction of lumbar region: Secondary | ICD-10-CM | POA: Diagnosis not present

## 2020-11-02 DIAGNOSIS — M9904 Segmental and somatic dysfunction of sacral region: Secondary | ICD-10-CM | POA: Diagnosis not present

## 2020-11-02 DIAGNOSIS — S336XXA Sprain of sacroiliac joint, initial encounter: Secondary | ICD-10-CM | POA: Diagnosis not present

## 2020-12-14 DIAGNOSIS — M9903 Segmental and somatic dysfunction of lumbar region: Secondary | ICD-10-CM | POA: Diagnosis not present

## 2020-12-14 DIAGNOSIS — S336XXA Sprain of sacroiliac joint, initial encounter: Secondary | ICD-10-CM | POA: Diagnosis not present

## 2020-12-14 DIAGNOSIS — M9904 Segmental and somatic dysfunction of sacral region: Secondary | ICD-10-CM | POA: Diagnosis not present

## 2020-12-14 DIAGNOSIS — M62830 Muscle spasm of back: Secondary | ICD-10-CM | POA: Diagnosis not present

## 2020-12-19 DIAGNOSIS — E559 Vitamin D deficiency, unspecified: Secondary | ICD-10-CM | POA: Diagnosis not present

## 2020-12-19 DIAGNOSIS — E7800 Pure hypercholesterolemia, unspecified: Secondary | ICD-10-CM | POA: Diagnosis not present

## 2020-12-19 DIAGNOSIS — Z0000 Encounter for general adult medical examination without abnormal findings: Secondary | ICD-10-CM | POA: Diagnosis not present

## 2020-12-21 ENCOUNTER — Other Ambulatory Visit: Payer: Self-pay | Admitting: Family Medicine

## 2020-12-21 DIAGNOSIS — M8580 Other specified disorders of bone density and structure, unspecified site: Secondary | ICD-10-CM

## 2021-02-02 ENCOUNTER — Other Ambulatory Visit: Payer: BC Managed Care – PPO

## 2021-02-07 DIAGNOSIS — L821 Other seborrheic keratosis: Secondary | ICD-10-CM | POA: Diagnosis not present

## 2021-02-07 DIAGNOSIS — L570 Actinic keratosis: Secondary | ICD-10-CM | POA: Diagnosis not present

## 2021-02-07 DIAGNOSIS — C44311 Basal cell carcinoma of skin of nose: Secondary | ICD-10-CM | POA: Diagnosis not present

## 2021-02-07 DIAGNOSIS — Z85828 Personal history of other malignant neoplasm of skin: Secondary | ICD-10-CM | POA: Diagnosis not present

## 2021-03-09 DIAGNOSIS — C44319 Basal cell carcinoma of skin of other parts of face: Secondary | ICD-10-CM | POA: Diagnosis not present

## 2021-07-24 ENCOUNTER — Ambulatory Visit
Admission: RE | Admit: 2021-07-24 | Discharge: 2021-07-24 | Disposition: A | Discharge status: Home / Self Care | Payer: BC Managed Care – PPO | Source: Ambulatory Visit | Attending: Family Medicine | Admitting: Family Medicine

## 2021-07-24 DIAGNOSIS — M810 Age-related osteoporosis without current pathological fracture: Secondary | ICD-10-CM | POA: Diagnosis not present

## 2021-07-24 DIAGNOSIS — Z780 Asymptomatic menopausal state: Secondary | ICD-10-CM | POA: Diagnosis not present

## 2021-07-24 DIAGNOSIS — M8588 Other specified disorders of bone density and structure, other site: Secondary | ICD-10-CM | POA: Diagnosis not present

## 2021-07-24 DIAGNOSIS — M8580 Other specified disorders of bone density and structure, unspecified site: Secondary | ICD-10-CM

## 2021-08-04 DIAGNOSIS — Z01419 Encounter for gynecological examination (general) (routine) without abnormal findings: Secondary | ICD-10-CM | POA: Diagnosis not present

## 2021-08-07 DIAGNOSIS — M62830 Muscle spasm of back: Secondary | ICD-10-CM | POA: Diagnosis not present

## 2021-08-07 DIAGNOSIS — S336XXA Sprain of sacroiliac joint, initial encounter: Secondary | ICD-10-CM | POA: Diagnosis not present

## 2021-08-07 DIAGNOSIS — M9904 Segmental and somatic dysfunction of sacral region: Secondary | ICD-10-CM | POA: Diagnosis not present

## 2021-08-07 DIAGNOSIS — M9903 Segmental and somatic dysfunction of lumbar region: Secondary | ICD-10-CM | POA: Diagnosis not present

## 2021-09-12 DIAGNOSIS — M62830 Muscle spasm of back: Secondary | ICD-10-CM | POA: Diagnosis not present

## 2021-09-12 DIAGNOSIS — M9903 Segmental and somatic dysfunction of lumbar region: Secondary | ICD-10-CM | POA: Diagnosis not present

## 2021-09-12 DIAGNOSIS — M9904 Segmental and somatic dysfunction of sacral region: Secondary | ICD-10-CM | POA: Diagnosis not present

## 2021-09-12 DIAGNOSIS — S336XXA Sprain of sacroiliac joint, initial encounter: Secondary | ICD-10-CM | POA: Diagnosis not present

## 2021-09-28 DIAGNOSIS — Z1231 Encounter for screening mammogram for malignant neoplasm of breast: Secondary | ICD-10-CM | POA: Diagnosis not present

## 2021-10-02 DIAGNOSIS — D225 Melanocytic nevi of trunk: Secondary | ICD-10-CM | POA: Diagnosis not present

## 2021-10-02 DIAGNOSIS — L570 Actinic keratosis: Secondary | ICD-10-CM | POA: Diagnosis not present

## 2021-10-02 DIAGNOSIS — L821 Other seborrheic keratosis: Secondary | ICD-10-CM | POA: Diagnosis not present

## 2021-10-02 DIAGNOSIS — Z85828 Personal history of other malignant neoplasm of skin: Secondary | ICD-10-CM | POA: Diagnosis not present

## 2021-10-02 DIAGNOSIS — D2262 Melanocytic nevi of left upper limb, including shoulder: Secondary | ICD-10-CM | POA: Diagnosis not present

## 2021-10-24 DIAGNOSIS — M9903 Segmental and somatic dysfunction of lumbar region: Secondary | ICD-10-CM | POA: Diagnosis not present

## 2021-10-24 DIAGNOSIS — M9904 Segmental and somatic dysfunction of sacral region: Secondary | ICD-10-CM | POA: Diagnosis not present

## 2021-10-24 DIAGNOSIS — S336XXA Sprain of sacroiliac joint, initial encounter: Secondary | ICD-10-CM | POA: Diagnosis not present

## 2021-10-24 DIAGNOSIS — M62830 Muscle spasm of back: Secondary | ICD-10-CM | POA: Diagnosis not present

## 2021-11-02 DIAGNOSIS — H524 Presbyopia: Secondary | ICD-10-CM | POA: Diagnosis not present

## 2021-11-02 DIAGNOSIS — H04123 Dry eye syndrome of bilateral lacrimal glands: Secondary | ICD-10-CM | POA: Diagnosis not present

## 2021-11-02 DIAGNOSIS — H43391 Other vitreous opacities, right eye: Secondary | ICD-10-CM | POA: Diagnosis not present

## 2021-11-02 DIAGNOSIS — H25813 Combined forms of age-related cataract, bilateral: Secondary | ICD-10-CM | POA: Diagnosis not present

## 2021-12-19 DIAGNOSIS — M9903 Segmental and somatic dysfunction of lumbar region: Secondary | ICD-10-CM | POA: Diagnosis not present

## 2021-12-19 DIAGNOSIS — M9904 Segmental and somatic dysfunction of sacral region: Secondary | ICD-10-CM | POA: Diagnosis not present

## 2021-12-19 DIAGNOSIS — S336XXA Sprain of sacroiliac joint, initial encounter: Secondary | ICD-10-CM | POA: Diagnosis not present

## 2021-12-19 DIAGNOSIS — M62830 Muscle spasm of back: Secondary | ICD-10-CM | POA: Diagnosis not present

## 2021-12-28 DIAGNOSIS — Z0000 Encounter for general adult medical examination without abnormal findings: Secondary | ICD-10-CM | POA: Diagnosis not present

## 2021-12-28 DIAGNOSIS — M810 Age-related osteoporosis without current pathological fracture: Secondary | ICD-10-CM | POA: Diagnosis not present

## 2021-12-28 DIAGNOSIS — E7800 Pure hypercholesterolemia, unspecified: Secondary | ICD-10-CM | POA: Diagnosis not present

## 2021-12-28 DIAGNOSIS — E559 Vitamin D deficiency, unspecified: Secondary | ICD-10-CM | POA: Diagnosis not present

## 2022-02-12 IMAGING — CR DG CHEST 2V
2 series · 2 of 2 positions shown · non-contrast
Comparison: None.

CLINICAL DATA: Woke with chest pain, sudden onset of mid chest pain

EXAM:
CHEST - 2 VIEW

[w chest pa]
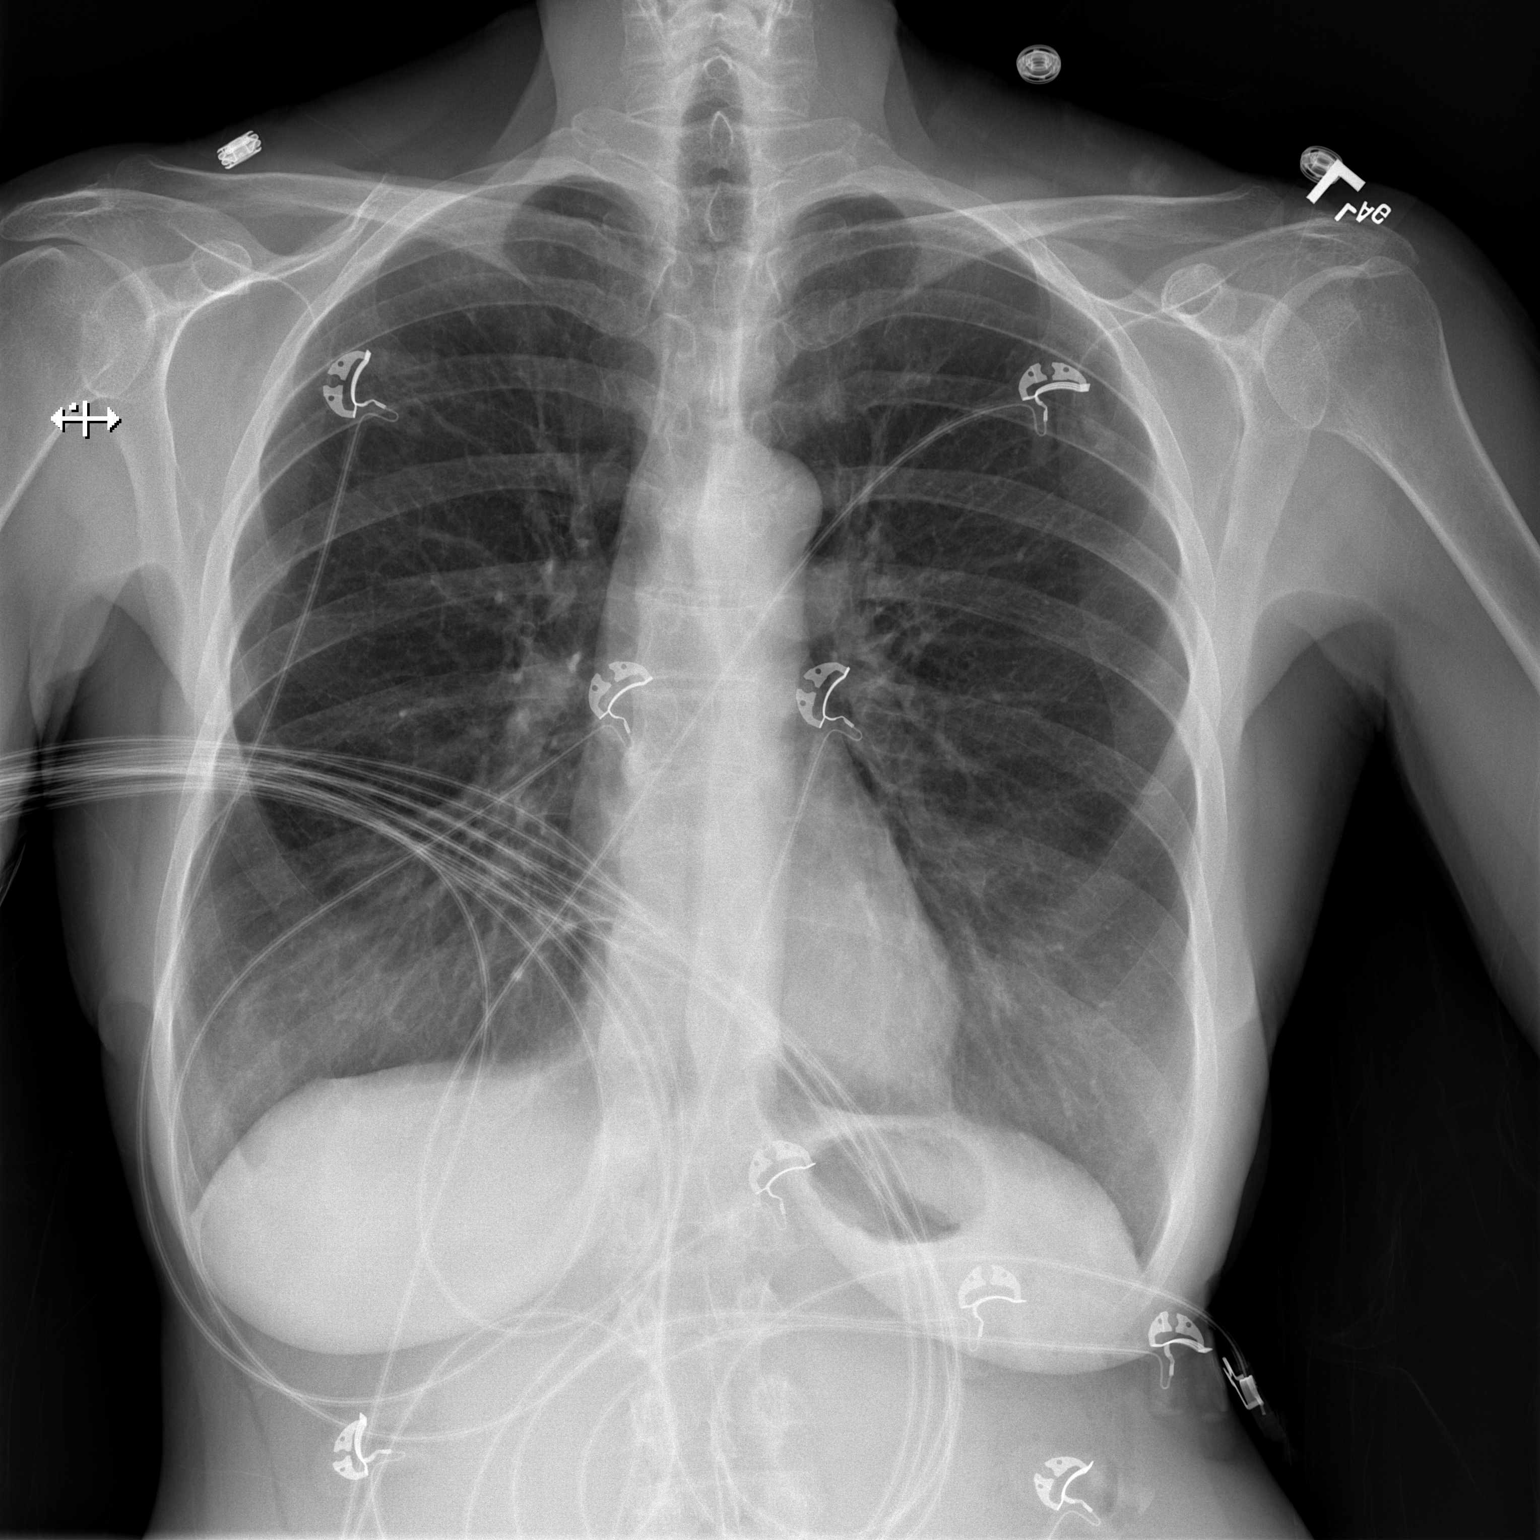

[w chest lat]
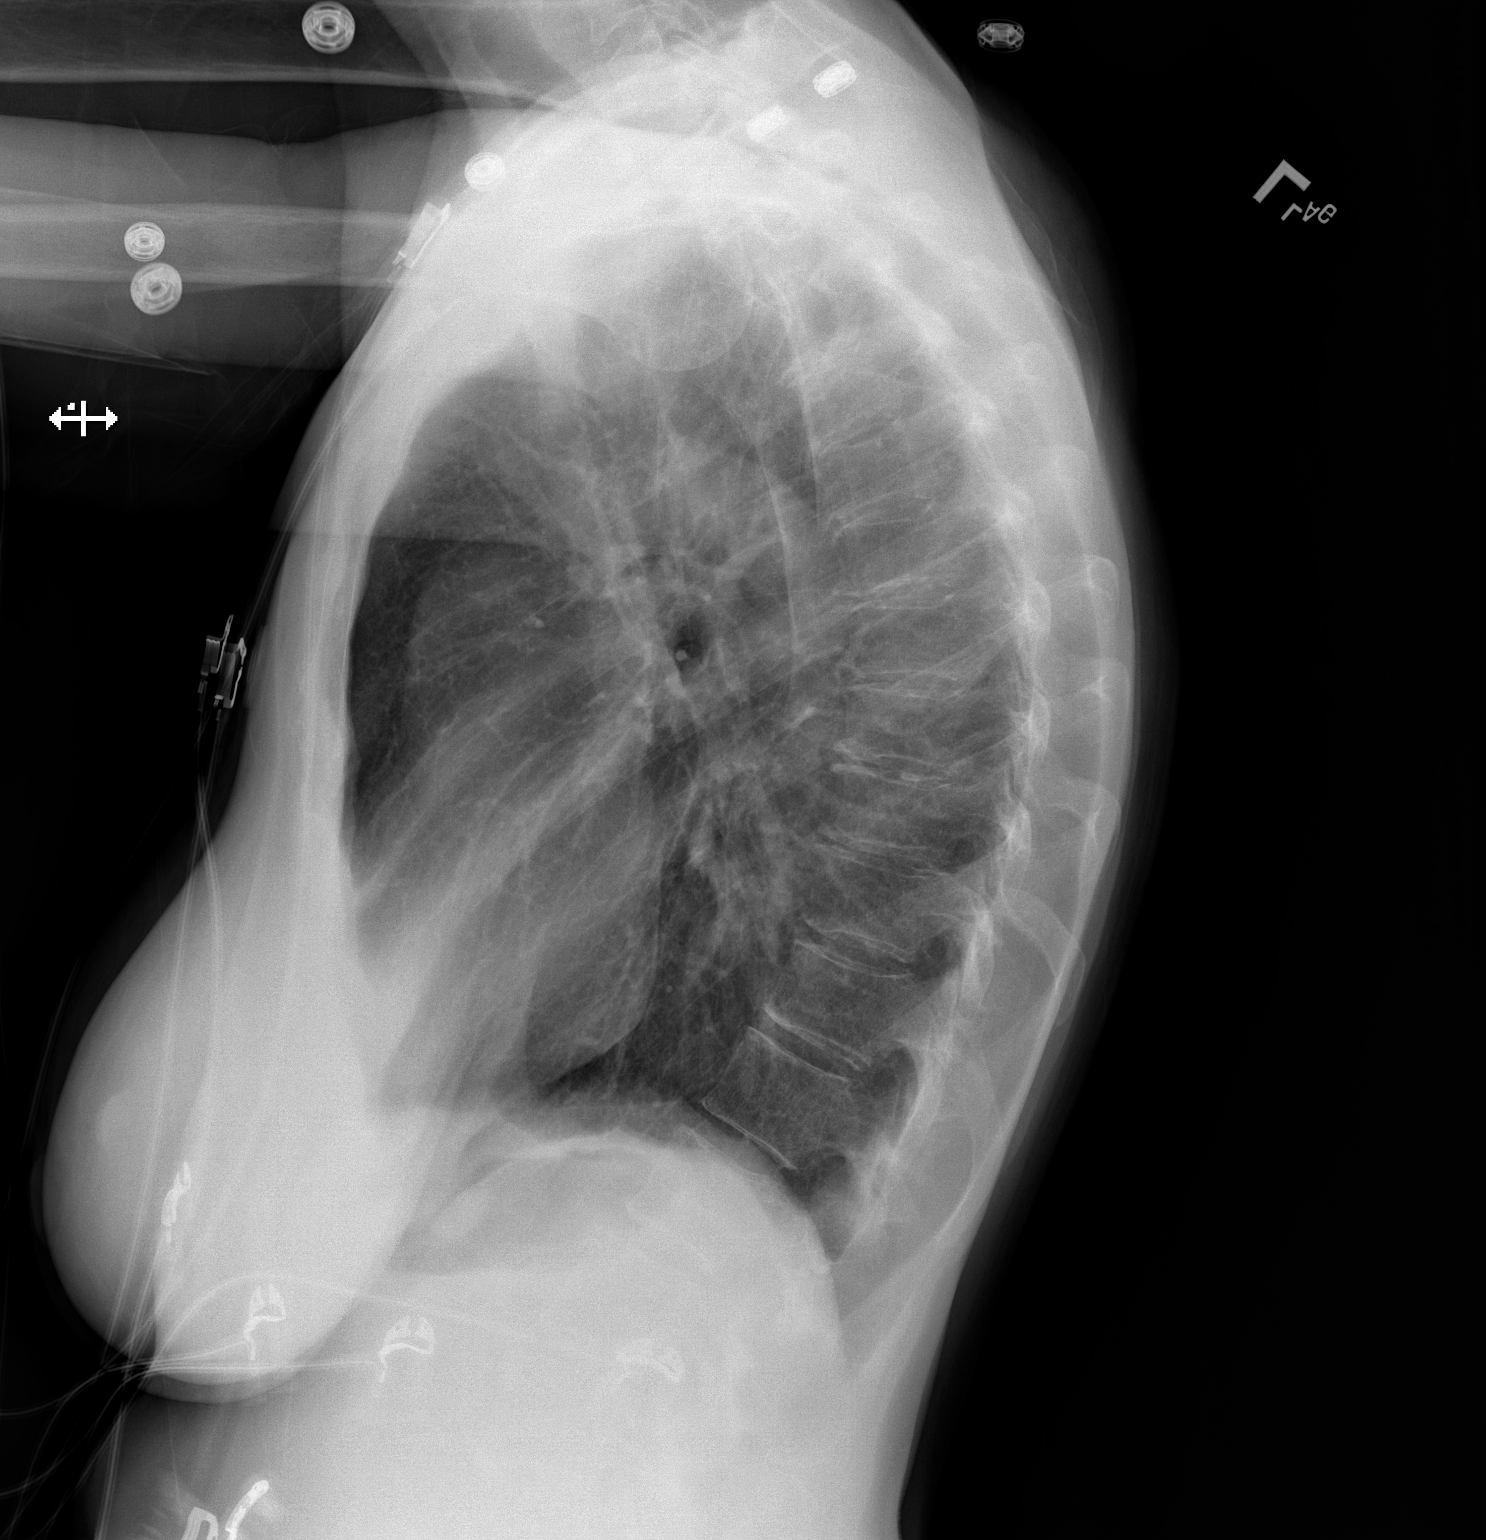

[2 of 2 positions shown; findings below may reference images not displayed]

FINDINGS: No consolidation, features of edema, pneumothorax, or effusion.
Pulmonary vascularity is normally distributed. The cardiomediastinal
contours are unremarkable. No acute osseous or soft tissue
abnormality.
IMPRESSION: No acute cardiopulmonary abnormality.

## 2022-03-06 DIAGNOSIS — I788 Other diseases of capillaries: Secondary | ICD-10-CM | POA: Diagnosis not present

## 2022-03-06 DIAGNOSIS — Z85828 Personal history of other malignant neoplasm of skin: Secondary | ICD-10-CM | POA: Diagnosis not present

## 2022-03-06 DIAGNOSIS — L570 Actinic keratosis: Secondary | ICD-10-CM | POA: Diagnosis not present

## 2022-03-26 DIAGNOSIS — M9903 Segmental and somatic dysfunction of lumbar region: Secondary | ICD-10-CM | POA: Diagnosis not present

## 2022-03-26 DIAGNOSIS — M62830 Muscle spasm of back: Secondary | ICD-10-CM | POA: Diagnosis not present

## 2022-03-26 DIAGNOSIS — S336XXA Sprain of sacroiliac joint, initial encounter: Secondary | ICD-10-CM | POA: Diagnosis not present

## 2022-03-26 DIAGNOSIS — M9904 Segmental and somatic dysfunction of sacral region: Secondary | ICD-10-CM | POA: Diagnosis not present

## 2022-04-25 DIAGNOSIS — M9904 Segmental and somatic dysfunction of sacral region: Secondary | ICD-10-CM | POA: Diagnosis not present

## 2022-04-25 DIAGNOSIS — M62830 Muscle spasm of back: Secondary | ICD-10-CM | POA: Diagnosis not present

## 2022-04-25 DIAGNOSIS — M9903 Segmental and somatic dysfunction of lumbar region: Secondary | ICD-10-CM | POA: Diagnosis not present

## 2022-04-25 DIAGNOSIS — S336XXA Sprain of sacroiliac joint, initial encounter: Secondary | ICD-10-CM | POA: Diagnosis not present

## 2022-05-23 DIAGNOSIS — M9903 Segmental and somatic dysfunction of lumbar region: Secondary | ICD-10-CM | POA: Diagnosis not present

## 2022-05-23 DIAGNOSIS — M62830 Muscle spasm of back: Secondary | ICD-10-CM | POA: Diagnosis not present

## 2022-05-23 DIAGNOSIS — S336XXA Sprain of sacroiliac joint, initial encounter: Secondary | ICD-10-CM | POA: Diagnosis not present

## 2022-05-23 DIAGNOSIS — M9904 Segmental and somatic dysfunction of sacral region: Secondary | ICD-10-CM | POA: Diagnosis not present

## 2022-07-04 DIAGNOSIS — M9903 Segmental and somatic dysfunction of lumbar region: Secondary | ICD-10-CM | POA: Diagnosis not present

## 2022-07-04 DIAGNOSIS — M9904 Segmental and somatic dysfunction of sacral region: Secondary | ICD-10-CM | POA: Diagnosis not present

## 2022-07-04 DIAGNOSIS — S336XXA Sprain of sacroiliac joint, initial encounter: Secondary | ICD-10-CM | POA: Diagnosis not present

## 2022-07-04 DIAGNOSIS — M62830 Muscle spasm of back: Secondary | ICD-10-CM | POA: Diagnosis not present

## 2022-08-15 DIAGNOSIS — S336XXA Sprain of sacroiliac joint, initial encounter: Secondary | ICD-10-CM | POA: Diagnosis not present

## 2022-08-15 DIAGNOSIS — M62830 Muscle spasm of back: Secondary | ICD-10-CM | POA: Diagnosis not present

## 2022-08-15 DIAGNOSIS — M9904 Segmental and somatic dysfunction of sacral region: Secondary | ICD-10-CM | POA: Diagnosis not present

## 2022-08-15 DIAGNOSIS — M9903 Segmental and somatic dysfunction of lumbar region: Secondary | ICD-10-CM | POA: Diagnosis not present

## 2022-09-26 DIAGNOSIS — M9904 Segmental and somatic dysfunction of sacral region: Secondary | ICD-10-CM | POA: Diagnosis not present

## 2022-09-26 DIAGNOSIS — S336XXA Sprain of sacroiliac joint, initial encounter: Secondary | ICD-10-CM | POA: Diagnosis not present

## 2022-09-26 DIAGNOSIS — M9903 Segmental and somatic dysfunction of lumbar region: Secondary | ICD-10-CM | POA: Diagnosis not present

## 2022-09-26 DIAGNOSIS — M62830 Muscle spasm of back: Secondary | ICD-10-CM | POA: Diagnosis not present

## 2022-10-31 DIAGNOSIS — L570 Actinic keratosis: Secondary | ICD-10-CM | POA: Diagnosis not present

## 2022-10-31 DIAGNOSIS — L814 Other melanin hyperpigmentation: Secondary | ICD-10-CM | POA: Diagnosis not present

## 2022-10-31 DIAGNOSIS — L821 Other seborrheic keratosis: Secondary | ICD-10-CM | POA: Diagnosis not present

## 2022-10-31 DIAGNOSIS — Z85828 Personal history of other malignant neoplasm of skin: Secondary | ICD-10-CM | POA: Diagnosis not present

## 2022-10-31 DIAGNOSIS — D2261 Melanocytic nevi of right upper limb, including shoulder: Secondary | ICD-10-CM | POA: Diagnosis not present

## 2022-11-14 DIAGNOSIS — S336XXA Sprain of sacroiliac joint, initial encounter: Secondary | ICD-10-CM | POA: Diagnosis not present

## 2022-11-14 DIAGNOSIS — M9904 Segmental and somatic dysfunction of sacral region: Secondary | ICD-10-CM | POA: Diagnosis not present

## 2022-11-14 DIAGNOSIS — M62830 Muscle spasm of back: Secondary | ICD-10-CM | POA: Diagnosis not present

## 2022-11-14 DIAGNOSIS — M9903 Segmental and somatic dysfunction of lumbar region: Secondary | ICD-10-CM | POA: Diagnosis not present

## 2022-12-26 DIAGNOSIS — M9904 Segmental and somatic dysfunction of sacral region: Secondary | ICD-10-CM | POA: Diagnosis not present

## 2022-12-26 DIAGNOSIS — S336XXA Sprain of sacroiliac joint, initial encounter: Secondary | ICD-10-CM | POA: Diagnosis not present

## 2022-12-26 DIAGNOSIS — M62830 Muscle spasm of back: Secondary | ICD-10-CM | POA: Diagnosis not present

## 2022-12-26 DIAGNOSIS — M9903 Segmental and somatic dysfunction of lumbar region: Secondary | ICD-10-CM | POA: Diagnosis not present

## 2023-01-08 DIAGNOSIS — E7800 Pure hypercholesterolemia, unspecified: Secondary | ICD-10-CM | POA: Diagnosis not present

## 2023-01-08 DIAGNOSIS — M810 Age-related osteoporosis without current pathological fracture: Secondary | ICD-10-CM | POA: Diagnosis not present

## 2023-01-08 DIAGNOSIS — R718 Other abnormality of red blood cells: Secondary | ICD-10-CM | POA: Diagnosis not present

## 2023-01-08 DIAGNOSIS — Z0000 Encounter for general adult medical examination without abnormal findings: Secondary | ICD-10-CM | POA: Diagnosis not present

## 2023-01-08 DIAGNOSIS — R7989 Other specified abnormal findings of blood chemistry: Secondary | ICD-10-CM | POA: Diagnosis not present

## 2023-01-08 DIAGNOSIS — E559 Vitamin D deficiency, unspecified: Secondary | ICD-10-CM | POA: Diagnosis not present

## 2023-02-13 DIAGNOSIS — M62830 Muscle spasm of back: Secondary | ICD-10-CM | POA: Diagnosis not present

## 2023-02-13 DIAGNOSIS — S336XXA Sprain of sacroiliac joint, initial encounter: Secondary | ICD-10-CM | POA: Diagnosis not present

## 2023-02-13 DIAGNOSIS — M9903 Segmental and somatic dysfunction of lumbar region: Secondary | ICD-10-CM | POA: Diagnosis not present

## 2023-02-13 DIAGNOSIS — M9904 Segmental and somatic dysfunction of sacral region: Secondary | ICD-10-CM | POA: Diagnosis not present

## 2023-02-22 DIAGNOSIS — Z01419 Encounter for gynecological examination (general) (routine) without abnormal findings: Secondary | ICD-10-CM | POA: Diagnosis not present

## 2023-02-22 DIAGNOSIS — Z6821 Body mass index (BMI) 21.0-21.9, adult: Secondary | ICD-10-CM | POA: Diagnosis not present

## 2023-03-20 DIAGNOSIS — Z1231 Encounter for screening mammogram for malignant neoplasm of breast: Secondary | ICD-10-CM | POA: Diagnosis not present

## 2023-04-03 DIAGNOSIS — S336XXA Sprain of sacroiliac joint, initial encounter: Secondary | ICD-10-CM | POA: Diagnosis not present

## 2023-04-03 DIAGNOSIS — M62830 Muscle spasm of back: Secondary | ICD-10-CM | POA: Diagnosis not present

## 2023-04-03 DIAGNOSIS — M9903 Segmental and somatic dysfunction of lumbar region: Secondary | ICD-10-CM | POA: Diagnosis not present

## 2023-04-03 DIAGNOSIS — M9904 Segmental and somatic dysfunction of sacral region: Secondary | ICD-10-CM | POA: Diagnosis not present

## 2023-05-01 DIAGNOSIS — H02831 Dermatochalasis of right upper eyelid: Secondary | ICD-10-CM | POA: Diagnosis not present

## 2023-05-01 DIAGNOSIS — H43391 Other vitreous opacities, right eye: Secondary | ICD-10-CM | POA: Diagnosis not present

## 2023-05-01 DIAGNOSIS — H524 Presbyopia: Secondary | ICD-10-CM | POA: Diagnosis not present

## 2023-05-01 DIAGNOSIS — H04123 Dry eye syndrome of bilateral lacrimal glands: Secondary | ICD-10-CM | POA: Diagnosis not present

## 2023-05-01 DIAGNOSIS — H25813 Combined forms of age-related cataract, bilateral: Secondary | ICD-10-CM | POA: Diagnosis not present

## 2023-05-15 DIAGNOSIS — M62830 Muscle spasm of back: Secondary | ICD-10-CM | POA: Diagnosis not present

## 2023-05-15 DIAGNOSIS — S336XXA Sprain of sacroiliac joint, initial encounter: Secondary | ICD-10-CM | POA: Diagnosis not present

## 2023-05-15 DIAGNOSIS — M9903 Segmental and somatic dysfunction of lumbar region: Secondary | ICD-10-CM | POA: Diagnosis not present

## 2023-05-15 DIAGNOSIS — M9904 Segmental and somatic dysfunction of sacral region: Secondary | ICD-10-CM | POA: Diagnosis not present

## 2023-06-26 DIAGNOSIS — S336XXA Sprain of sacroiliac joint, initial encounter: Secondary | ICD-10-CM | POA: Diagnosis not present

## 2023-06-26 DIAGNOSIS — M9903 Segmental and somatic dysfunction of lumbar region: Secondary | ICD-10-CM | POA: Diagnosis not present

## 2023-06-26 DIAGNOSIS — M62830 Muscle spasm of back: Secondary | ICD-10-CM | POA: Diagnosis not present

## 2023-06-26 DIAGNOSIS — M9904 Segmental and somatic dysfunction of sacral region: Secondary | ICD-10-CM | POA: Diagnosis not present

## 2023-07-05 DIAGNOSIS — Z1211 Encounter for screening for malignant neoplasm of colon: Secondary | ICD-10-CM | POA: Diagnosis not present

## 2023-07-05 DIAGNOSIS — K635 Polyp of colon: Secondary | ICD-10-CM | POA: Diagnosis not present

## 2023-07-05 DIAGNOSIS — Z800 Family history of malignant neoplasm of digestive organs: Secondary | ICD-10-CM | POA: Diagnosis not present

## 2023-08-06 DIAGNOSIS — Z23 Encounter for immunization: Secondary | ICD-10-CM | POA: Diagnosis not present

## 2023-08-07 DIAGNOSIS — M9903 Segmental and somatic dysfunction of lumbar region: Secondary | ICD-10-CM | POA: Diagnosis not present

## 2023-08-07 DIAGNOSIS — M62830 Muscle spasm of back: Secondary | ICD-10-CM | POA: Diagnosis not present

## 2023-08-07 DIAGNOSIS — M9904 Segmental and somatic dysfunction of sacral region: Secondary | ICD-10-CM | POA: Diagnosis not present

## 2023-08-07 DIAGNOSIS — S336XXA Sprain of sacroiliac joint, initial encounter: Secondary | ICD-10-CM | POA: Diagnosis not present

## 2023-09-16 DIAGNOSIS — M62830 Muscle spasm of back: Secondary | ICD-10-CM | POA: Diagnosis not present

## 2023-09-16 DIAGNOSIS — S336XXA Sprain of sacroiliac joint, initial encounter: Secondary | ICD-10-CM | POA: Diagnosis not present

## 2023-09-16 DIAGNOSIS — M9904 Segmental and somatic dysfunction of sacral region: Secondary | ICD-10-CM | POA: Diagnosis not present

## 2023-09-16 DIAGNOSIS — M9903 Segmental and somatic dysfunction of lumbar region: Secondary | ICD-10-CM | POA: Diagnosis not present

## 2023-09-26 DIAGNOSIS — L570 Actinic keratosis: Secondary | ICD-10-CM | POA: Diagnosis not present

## 2023-09-26 DIAGNOSIS — C44619 Basal cell carcinoma of skin of left upper limb, including shoulder: Secondary | ICD-10-CM | POA: Diagnosis not present

## 2023-10-31 DIAGNOSIS — C44619 Basal cell carcinoma of skin of left upper limb, including shoulder: Secondary | ICD-10-CM | POA: Diagnosis not present

## 2023-10-31 DIAGNOSIS — L988 Other specified disorders of the skin and subcutaneous tissue: Secondary | ICD-10-CM | POA: Diagnosis not present

## 2024-02-26 ENCOUNTER — Other Ambulatory Visit (HOSPITAL_BASED_OUTPATIENT_CLINIC_OR_DEPARTMENT_OTHER): Payer: Self-pay | Admitting: Family Medicine

## 2024-02-26 DIAGNOSIS — M810 Age-related osteoporosis without current pathological fracture: Secondary | ICD-10-CM

## 2024-06-04 ENCOUNTER — Emergency Department (HOSPITAL_COMMUNITY)

## 2024-06-04 ENCOUNTER — Encounter (HOSPITAL_COMMUNITY): Payer: Self-pay

## 2024-06-04 ENCOUNTER — Other Ambulatory Visit: Payer: Self-pay

## 2024-06-04 ENCOUNTER — Inpatient Hospital Stay (HOSPITAL_COMMUNITY)
Admission: EM | Admit: 2024-06-04 | Discharge: 2024-06-07 | DRG: 494 | Disposition: A | Discharge status: Home / Self Care

## 2024-06-04 DIAGNOSIS — S82841A Displaced bimalleolar fracture of right lower leg, initial encounter for closed fracture: Principal | ICD-10-CM | POA: Diagnosis present

## 2024-06-04 DIAGNOSIS — Z79899 Other long term (current) drug therapy: Secondary | ICD-10-CM | POA: Diagnosis not present

## 2024-06-04 DIAGNOSIS — Z880 Allergy status to penicillin: Secondary | ICD-10-CM

## 2024-06-04 DIAGNOSIS — M810 Age-related osteoporosis without current pathological fracture: Secondary | ICD-10-CM | POA: Diagnosis present

## 2024-06-04 DIAGNOSIS — W010XXA Fall on same level from slipping, tripping and stumbling without subsequent striking against object, initial encounter: Secondary | ICD-10-CM | POA: Diagnosis present

## 2024-06-04 DIAGNOSIS — S82891A Other fracture of right lower leg, initial encounter for closed fracture: Principal | ICD-10-CM

## 2024-06-04 HISTORY — DX: Age-related osteoporosis without current pathological fracture: M81.0

## 2024-06-04 LAB — BASIC METABOLIC PANEL WITH GFR
Anion gap: 10 (ref 5–15)
BUN: 11 mg/dL (ref 8–23)
CO2: 26 mmol/L (ref 22–32)
Calcium: 9.5 mg/dL (ref 8.9–10.3)
Chloride: 103 mmol/L (ref 98–111)
Creatinine, Ser: 0.81 mg/dL (ref 0.44–1.00)
GFR, Estimated: >60 mL/min (ref >60)
Glucose, Bld: 122 mg/dL — ABNORMAL HIGH (ref 70–99)
Potassium: 4 mmol/L (ref 3.5–5.1)
Sodium: 140 mmol/L (ref 135–145)

## 2024-06-04 LAB — CBC WITH DIFFERENTIAL/PLATELET
Abs Immature Granulocytes: 0.03 K/uL (ref 0.00–0.07)
Basophils Absolute: 0 K/uL (ref 0.0–0.1)
Basophils Relative: 0 %
Eosinophils Absolute: 0 K/uL (ref 0.0–0.5)
Eosinophils Relative: 0 %
HCT: 38.9 % (ref 36.0–46.0)
Hemoglobin: 12.5 g/dL (ref 12.0–15.0)
Immature Granulocytes: 0 %
Lymphocytes Relative: 22 %
Lymphs Abs: 1.7 K/uL (ref 0.7–4.0)
MCH: 30.6 pg (ref 26.0–34.0)
MCHC: 32.1 g/dL (ref 30.0–36.0)
MCV: 95.3 fL (ref 80.0–100.0)
Monocytes Absolute: 0.7 K/uL (ref 0.1–1.0)
Monocytes Relative: 8 %
Neutro Abs: 5.4 K/uL (ref 1.7–7.7)
Neutrophils Relative %: 70 %
Platelets: 240 K/uL (ref 150–400)
RBC: 4.08 MIL/uL (ref 3.87–5.11)
RDW: 13.5 % (ref 11.5–15.5)
WBC: 7.8 K/uL (ref 4.0–10.5)
nRBC: 0 % (ref 0.0–0.2)

## 2024-06-04 MED ORDER — MORPHINE SULFATE (PF) 2 MG/ML IV SOLN: 2.0000 mg | Freq: Once | INTRAVENOUS | Status: DC

## 2024-06-04 MED ORDER — PROPOFOL 10 MG/ML IV BOLUS
1.0000 mg/kg | Freq: Once | INTRAVENOUS | Status: AC
  Administered 2024-06-04: 10 mg via INTRAVENOUS
  Administered 2024-06-04: 35 mg via INTRAVENOUS
  Filled 2024-06-04: qty 20

## 2024-06-04 MED ORDER — OXYCODONE-ACETAMINOPHEN 5-325 MG PO TABS
1.0000 | ORAL_TABLET | Freq: Four times a day (QID) | ORAL | Status: DC | PRN
  Administered 2024-06-04 – 2024-06-05 (×4): 1 via ORAL
  Administered 2024-06-06: 2 via ORAL
  Administered 2024-06-07 (×3): 1 via ORAL
  Filled 2024-06-04 (×2): qty 1
  Filled 2024-06-04: qty 2
  Filled 2024-06-04: qty 1
  Filled 2024-06-04: qty 2
  Filled 2024-06-04 (×4): qty 1

## 2024-06-04 NOTE — Progress Notes (Signed)
 Orthopedic Tech Progress Note Patient Details:  Michele Ross 1961/03/21 [id number]  Ortho Devices Type of Ortho Device: Ace wrap, Cotton web roll, Stirrup splint, Post (short leg) splint Ortho Device/Splint Location: short leg posterior and stirrup splint applied to right leg Ortho Device/Splint Interventions: Ordered, Application, Adjustment   Post Interventions Patient Tolerated: Well Instructions Provided: Adjustment of device, Care of device  [name] 06/04/2024, 8:55 PM

## 2024-06-04 NOTE — ED Provider Notes (Signed)
  [facility] Provider Note .Sedation  Date/Time: 06/04/2024 11:15 PM  Performed by: Mannie Fairy DASEN, DO Authorized by: Mannie Fairy DASEN, DO   Consent:    Consent obtained:  Written   Consent given by:  Patient   Risks discussed:  Prolonged hypoxia resulting in organ damage and allergic reaction Universal protocol:    Immediately prior to procedure, a time out was called: yes     Patient identity confirmed:  Arm band and verbally with patient Indications:    Procedure performed:  Fracture reduction Pre-sedation assessment:    Time since last food or drink:  3   ASA classification: class 1 - normal, healthy patient     Mouth opening:  3 or more finger widths   Thyromental distance:  4 finger widths   Mallampati score:  I - soft palate, uvula, fauces, pillars visible   Neck mobility: normal     Pre-sedation assessments completed and reviewed: airway patency, cardiovascular function and mental status   A pre-sedation assessment was completed prior to the start of the procedure Immediate pre-procedure details:    Reviewed: vital signs   Procedure details (see MAR for exact dosages):    Sedation:  Propofol   Intended level of sedation: deep   Total Provider sedation time (minutes):  15 Post-procedure details:   A post-sedation assessment was completed following the completion of the procedure.   Attendance: Constant attendance by certified staff until patient recovered     Procedure completion:  Tolerated well, no immediate complications .Reduction of fracture  Date/Time: 06/04/2024 11:16 PM  Performed by: Mannie Fairy DASEN, DO Authorized by: Mannie Fairy DASEN, DO  Consent: Written consent obtained Patient identity confirmed: arm band  Sedation: Patient sedated: yes Sedatives: propofol  Comments: Bimalleolar fracture reduced under propofol sedation.  Splint placed by orthopedic tech.  Patient tolerated procedure well.  Good positioning on postreduction  films.         Mannie Fairy T, DO 06/04/24 2317

## 2024-06-04 NOTE — Progress Notes (Signed)
 20:42 called for ankle reduction, placed on 2L O2 via ETCO2 cannula. HR 75, rate 21, Spo2 100, ETCO2 29, BP 140/71. Suction and ambu at head of bed, emergency airway equipment available. Tolerated procedure well. Mild hypopnea noted without apnea. Vitals remain stable throughout procedure. Post procedure vitals 20:42: HR 63, rate 26, Spo2 100, ETCO2 33, BP 138/67. Patient was awake and communicative upon RT exit.

## 2024-06-04 NOTE — ED Provider Notes (Signed)
 [facility]ENC[facility] DEPARTMENT AT [facility] Provider Note   CSN: [id number] Arrival date & time: 06/04/24  1858     Patient presents with: Ankle Injury   Michele Ross is a 63 y.o. female presents today for right ankle pain after rolling right ankle while playing with her dog.  Patient denies head injury, LOC, blood thinner use, or any other complaints at this time.  Patient denies numbness or tingling.    Ankle Injury       Prior to Admission medications   Medication Sig Start Date End Date Taking? Authorizing Provider  Biotin w/ Vitamins C & E (HAIR/SKIN/NAILS PO) Take 1 tablet by mouth daily. Chew    [provider]  Calcium Carbonate-Vitamin D (CALCIUM-VITAMIN D3 PO) Take 1 tablet by mouth daily at 12 noon. Gummie    [provider]  fluticasone (FLONASE) 50 MCG/ACT nasal spray Place 1 spray into both nostrils daily as needed.    [provider]  Multiple Vitamins-Minerals (MULTIVITAMIN WITH MINERALS) tablet Take 1 tablet by mouth daily.    [provider]  omega-3 acid ethyl esters (LOVAZA) 1 g capsule Take 1 g by mouth 2 (two) times daily. Omega red 300 mg    [provider]  OVER THE COUNTER MEDICATION Take 1 tablet by mouth daily as needed (Supplement). Gold coin Grass/ liver supplement    [provider]  rosuvastatin (CRESTOR) 5 MG tablet Take 5 mg by mouth daily. 07/25/19   [provider]    Allergies: Penicillins    Review of Systems  Musculoskeletal:  Positive for arthralgias.    Updated Vital Signs BP (!) 142/69   Pulse 80   Temp 97.9 F (36.6 C) (Oral)   Resp 14   Ht 5' 5 (1.651 m)   Wt 57.7 kg   SpO2 100%   BMI 21.17 kg/m   Physical Exam Vitals and nursing note reviewed.  Constitutional:      General: She is not in acute distress.    Appearance: Normal appearance. She is well-developed. She is not ill-appearing.  HENT:     Head: Normocephalic and atraumatic.  Eyes:      Conjunctiva/sclera: Conjunctivae normal.  Cardiovascular:     Rate and Rhythm: Normal rate and regular rhythm.     Heart sounds: No murmur heard. Pulmonary:     Effort: Pulmonary effort is normal. No respiratory distress.     Breath sounds: Normal breath sounds.  Abdominal:     Palpations: Abdomen is soft.     Tenderness: There is no abdominal tenderness.  Musculoskeletal:        General: Swelling, tenderness, deformity and signs of injury present.     Cervical back: Neck supple.     Comments: Obvious deformity to patient's right ankle.  Patient is neurovascularly intact.  Patient has +2 dorsalis pedis pulses and is able to wiggle her toes.  Skin:    General: Skin is warm and dry.     Capillary Refill: Capillary refill takes less than 2 seconds.  Neurological:     Mental Status: She is alert.  Psychiatric:        Mood and Affect: Mood normal.     (all labs ordered are listed, but only abnormal results are displayed) Labs Reviewed  BASIC METABOLIC PANEL WITH GFR - Abnormal; Notable for the following components:      Result Value   Glucose, Bld 122 (*)    All other components within normal  limits  CBC WITH DIFFERENTIAL/PLATELET    EKG: None  Radiology: DG Ankle 2 Views Right Result Date: 06/04/2024 CLINICAL DATA:  Fracture post reduction EXAM: RIGHT ANKLE - 2 VIEW COMPARISON:  06/04/2024 FINDINGS: Interval placement of cast material obscuring some bone detail. The comminuted fractures of the medial and lateral malleolus of the right ankle again demonstrated. Near anatomic alignment is demonstrated postreduction. No significant dislocation at the ankle joint. Soft tissues are unremarkable. IMPRESSION: Near anatomic alignment of bimalleolar fractures of the right ankle postreduction. Electronically Signed   By: Elsie Gravely M.D.   On: 06/04/2024 21:16   DG Ankle Complete Right Result Date: 06/04/2024 CLINICAL DATA:  Ankle pain, rolled ankle. EXAM: RIGHT ANKLE - COMPLETE  3+ VIEW COMPARISON:  None Available. FINDINGS: Ankle fracture dislocation. Comminuted displaced and angulated distal fibular fracture. Transverse medial malleolar fracture at the level of the ankle mortise. The distal fracture fragments and talus are dislocated laterally with respect to the tibial plafond limited assessment for posterior malleolar fracture on provided views. Generalized soft tissue edema. IMPRESSION: Bimalleolar ankle fracture dislocation with lateral dislocation of the distal fracture fragments and talus with respect to the tibial plafond. Electronically Signed   By: Andrea Gasman M.D.   On: 06/04/2024 19:33     Procedures   Medications Ordered in the ED  oxyCODONE-acetaminophen (PERCOCET/ROXICET) 5-325 MG per tablet 1-2 tablet (has no administration in time range)  propofol (DIPRIVAN) 10 mg/mL bolus/IV push 58 mg (10 mg Intravenous Given 06/04/24 2044)                                    Medical Decision Making Amount and/or Complexity of Data Reviewed Labs: ordered. Radiology: ordered.   This patient presents to the ED for concern of right ankle injury differential diagnosis includes fracture, dislocation, open fracture, musculoskeletal pain  Imaging Studies ordered:  I ordered imaging studies including right ankle x-ray I independently visualized and interpreted imaging which showed Bimalleolar ankle fracture dislocation with lateral dislocation of the distal fracture fragment and talus with respect to the tibial plafond I agree with the radiologist interpretation   Medicines ordered and prescription drug management:  I ordered medication including Percocet I have reviewed the patients home medicines and have made adjustments as needed   Problem List / ED Course:  Consulted Dr. Reyne with orthopedics who is agreeable to admission     Final diagnoses:  Closed fracture of right ankle, initial encounter    ED Discharge Orders     None           Francis Ileana SAILOR, PA-C 06/04/24 2135    Gravely Pac T, DO 06/04/24 2318

## 2024-06-04 NOTE — Sedation Documentation (Signed)
 ED Provider at bedside.

## 2024-06-04 NOTE — ED Triage Notes (Addendum)
 Patient report she rolled her right ankle after playing with her dog. Obvious deformity on right ankle. Patient denies hitting her head. Patient denies LOC. Patient denies taking blood thinner.

## 2024-06-05 LAB — SURGICAL PCR SCREEN
MRSA, PCR: NEGATIVE
Staphylococcus aureus: NEGATIVE

## 2024-06-05 MED ORDER — SODIUM CHLORIDE 0.9 % IV SOLN: INTRAVENOUS | Status: AC

## 2024-06-05 MED ORDER — ENSURE PRE-SURGERY PO LIQD
296.0000 mL | Freq: Once | ORAL | Status: AC
  Administered 2024-06-05: 296 mL via ORAL
  Filled 2024-06-05: qty 296

## 2024-06-05 MED ORDER — CEFAZOLIN SODIUM-DEXTROSE 2-4 GM/100ML-% IV SOLN: 2.0000 g | INTRAVENOUS | Status: AC

## 2024-06-05 MED ORDER — POVIDONE-IODINE 10 % EX SWAB: 2.0000 | Freq: Once | CUTANEOUS | Status: DC

## 2024-06-05 MED ORDER — CHLORHEXIDINE GLUCONATE 4 % EX SOLN: 60.0000 mL | Freq: Once | CUTANEOUS | Status: DC

## 2024-06-05 MED ORDER — ENSURE PRE-SURGERY PO LIQD
296.0000 mL | Freq: Once | ORAL | Status: AC
  Administered 2024-06-06: 296 mL via ORAL
  Filled 2024-06-05: qty 296

## 2024-06-05 NOTE — Plan of Care (Signed)

## 2024-06-05 NOTE — Anesthesia Preprocedure Evaluation (Addendum)
 Anesthesia Evaluation  Patient identified by MRN, date of birth, ID band Patient awake    Reviewed: Allergy & Precautions, NPO status , Patient's Chart, lab work & pertinent test results  Airway Mallampati: II  TM Distance: >3 FB Neck ROM: Full    Dental no notable dental hx. (+) Dental Advisory Given   Pulmonary neg pulmonary ROS   Pulmonary exam normal breath sounds clear to auscultation       Cardiovascular negative cardio ROS Normal cardiovascular exam Rhythm:Regular Rate:Normal     Neuro/Psych negative neurological ROS     GI/Hepatic negative GI ROS, Neg liver ROS,,,  Endo/Other  negative endocrine ROS    Renal/GU negative Renal ROS     Musculoskeletal negative musculoskeletal ROS (+)    Abdominal  (+) - obese  Peds  Hematology negative hematology ROS (+)   Anesthesia Other Findings   Reproductive/Obstetrics                              Anesthesia Physical Anesthesia Plan  ASA: 2  Anesthesia Plan: General   Post-op Pain Management: Regional block* and Ofirmev IV (intra-op)*   Induction: Intravenous  PONV Risk Score and Plan: 3 and Ondansetron, Dexamethasone and Treatment may vary due to age or medical condition  Airway Management Planned: LMA  Additional Equipment:   Intra-op Plan:   Post-operative Plan: Extubation in OR  Informed Consent: I have reviewed the patients History and Physical, chart, labs and discussed the procedure including the risks, benefits and alternatives for the proposed anesthesia with the patient or authorized representative who has indicated his/her understanding and acceptance.     Dental advisory given  Plan Discussed with: CRNA  Anesthesia Plan Comments: (Risks of anesthesia explained at length. This includes, but is not limited to, sore throat, damage to teeth, lips gums, tongue and vocal cords, nausea and vomiting, reactions to  medications, stroke, heart attack, and death. All patient questions were answered and the patient wishes to proceed. Risks of peripheral nerve block explained at length. This includes, but is not limited to, bleeding, infection, reactions to the medications, seizures, damage to surrounding structures, damage to nerves, permanent weakness, numbness, tingling and pain. All patient questions were answered and patient wishes to proceed with nerve block. )         Anesthesia Quick Evaluation

## 2024-06-05 NOTE — H&P (Signed)
 Orthopaedic Service H&P/Consult     Patient ID: Michele Ross MRN: [id number] DOB/AGE: 1961/06/19 63 y.o.  Chief Complaint: Right ankle pain HPI: Michele Ross is an 63 y.o. female.  Who tripped and fell yesterday.  She was playing with her grandchildren's dog, trying to get to her [place], when they collided and tripped and fell.  She complains of right ankle pain.  She denies any other injuries also or fall.  Denies any antecedent right ankle pain.  Denies any injuries to her head.  Past Medical History:  Diagnosis Date   Osteoporosis     History reviewed. No pertinent surgical history.  History reviewed. No pertinent family history. Social History:  reports that she has never smoked. She has never used smokeless tobacco. She reports that she does not currently use drugs. No history on file for alcohol use.  Allergies:  Allergies  Allergen Reactions   Penicillins Rash    Did it involve swelling of the face/tongue/throat, SOB, or low BP? No Did it involve sudden or severe rash/hives, skin peeling, or any reaction on the inside of your mouth or nose? No Did you need to seek medical attention at a hospital or doctor's office? No When did it last happen?      63 years old If all above answers are "NO", may proceed with cephalosporin use.    Medications Prior to Admission  Medication Sig Dispense Refill   Biotin w/ Vitamins C & E (HAIR/SKIN/NAILS PO) Take 1 tablet by mouth daily. Chew     Calcium Carbonate-Vitamin D (CALCIUM-VITAMIN D3 PO) Take 1 tablet by mouth daily at 12 noon. Gummie     fluticasone (FLONASE) 50 MCG/ACT nasal spray Place 1 spray into both nostrils daily as needed.     Multiple Vitamins-Minerals (MULTIVITAMIN WITH MINERALS) tablet Take 1 tablet by mouth daily.     omega-3 acid ethyl esters (LOVAZA) 1 g capsule Take 1 g by mouth 2 (two) times daily. Omega red 300 mg     OVER THE COUNTER MEDICATION Take 1 tablet by mouth daily as needed (Supplement). Gold coin  Grass/ liver supplement     rosuvastatin (CRESTOR) 5 MG tablet Take 5 mg by mouth daily.      Results for orders placed or performed during the hospital encounter of 06/04/24 (from the past 48 hours)  Basic metabolic panel     Status: Abnormal   Collection Time: 06/04/24  8:35 PM  Result Value Ref Range   Sodium 140 135 - 145 mmol/L   Potassium 4.0 3.5 - 5.1 mmol/L   Chloride 103 98 - 111 mmol/L   CO2 26 22 - 32 mmol/L   Glucose, Bld 122 (H) 70 - 99 mg/dL    Comment: Glucose reference range applies only to samples taken after fasting for at least 8 hours.   BUN 11 8 - 23 mg/dL   Creatinine, Ser 9.18 0.44 - 1.00 mg/dL   Calcium 9.5 8.9 - 10.3 mg/dL   GFR, Estimated >39 >39 mL/min    Comment: (NOTE) Calculated using the CKD-EPI Creatinine Equation (2021)    Anion gap 10 5 - 15    Comment: Performed at [facility], [address] [place], [place] [address]  CBC with Differential     Status: None   Collection Time: 06/04/24  8:35 PM  Result Value Ref Range   WBC 7.8 4.0 - 10.5 K/uL   RBC 4.08 3.87 - 5.11 MIL/uL   Hemoglobin 12.5 12.0 -  15.0 g/dL   HCT 61.0 63.9 - 53.9 %   MCV 95.3 80.0 - 100.0 fL   MCH 30.6 26.0 - 34.0 pg   MCHC 32.1 30.0 - 36.0 g/dL   RDW 86.4 88.4 - 84.4 %   Platelets 240 150 - 400 K/uL   nRBC 0.0 0.0 - 0.2 %   Neutrophils Relative % 70 %   Neutro Abs 5.4 1.7 - 7.7 K/uL   Lymphocytes Relative 22 %   Lymphs Abs 1.7 0.7 - 4.0 K/uL   Monocytes Relative 8 %   Monocytes Absolute 0.7 0.1 - 1.0 K/uL   Eosinophils Relative 0 %   Eosinophils Absolute 0.0 0.0 - 0.5 K/uL   Basophils Relative 0 %   Basophils Absolute 0.0 0.0 - 0.1 K/uL   Immature Granulocytes 0 %   Abs Immature Granulocytes 0.03 0.00 - 0.07 K/uL    Comment: Performed at [facility], [address], [place], [place] [address]   DG Ankle 2 Views Right Result Date: 06/04/2024 CLINICAL DATA:  Fracture post reduction EXAM: RIGHT ANKLE - 2 VIEW COMPARISON:   06/04/2024 FINDINGS: Interval placement of cast material obscuring some bone detail. The comminuted fractures of the medial and lateral malleolus of the right ankle again demonstrated. Near anatomic alignment is demonstrated postreduction. No significant dislocation at the ankle joint. Soft tissues are unremarkable. IMPRESSION: Near anatomic alignment of bimalleolar fractures of the right ankle postreduction. Electronically Signed   By: Elsie Gravely M.D.   On: 06/04/2024 21:16   DG Ankle Complete Right Result Date: 06/04/2024 CLINICAL DATA:  Ankle pain, rolled ankle. EXAM: RIGHT ANKLE - COMPLETE 3+ VIEW COMPARISON:  None Available. FINDINGS: Ankle fracture dislocation. Comminuted displaced and angulated distal fibular fracture. Transverse medial malleolar fracture at the level of the ankle mortise. The distal fracture fragments and talus are dislocated laterally with respect to the tibial plafond limited assessment for posterior malleolar fracture on provided views. Generalized soft tissue edema. IMPRESSION: Bimalleolar ankle fracture dislocation with lateral dislocation of the distal fracture fragments and talus with respect to the tibial plafond. Electronically Signed   By: Andrea Gasman M.D.   On: 06/04/2024 19:33      Blood pressure 110/62, pulse 71, temperature 97.9 F (36.6 C), temperature source Oral, resp. rate 18, height 5' 5 (1.651 m), weight 59.3 kg, SpO2 100%.  Physical exam: Well-appearing woman in no acute distress. Mood is calm. She is alert and oriented. Head is normal cephalic atraumatic Heart rate is regular Breathing comfortably on room air Abdomen is soft and supple Examination of bilateral upper extremities as well as left lower extremity Veals no tense of patient no crepitus defects deformities Her right ankle is in a splint.  She has 3-4 of strength in the EHL and FHL.  Intact sensation in the tibial and peroneal nerve distributions.  No pain with passive stretch.   Good capillary refill no tense the patient about her knee or hip  X-rays: Multiple views of the right ankle reveal a displaced right bimalleolar ankle fracture.  This is in close reduced and is now in acceptable alignment right  Assessment/Plan  Right bimalleolar ankle fracture  The patient does have a right by mall ankle fracture.  This require surgical stabilization.  This will be an open reduction internal fixation.  Will plan for surgery be done today.  In the meantime we will keep the leg elevated.  We discussed the risks of surgery including but limited to bleeding, infection, injury  to nerves or tendons, poor wound healing, nonunion, malunion, hardware failure, and the risk of anesthesia.  We assisted goals of surgery provided for proper alignment of the bones prevent the morbidity associated with posttraumatic arthritis and malunion.  Informed sent was obtained.  Will plan for surgery later on today.  -(ortho injury):  - Pain management:  Continue with current pain regimen - DVT/PE prophylaxis:  Will hold for now but start aspirin  after surgery.  The patient can have teds and SCDs on her left leg - ID:   Preoperative antibiotics -- Activity:  Nonweightbearing right ankle -Ex-fix/Splint care:  Splint in place on right ankle  Weightbearing: NWB RLE   Cordella Rhein, MD, MS [facility] [phone]

## 2024-06-05 NOTE — Plan of Care (Signed)
  Problem: Safety: Goal: Ability to remain free from injury will improve Outcome: Progressing   Problem: Pain Managment: Goal: General experience of comfort will improve and/or be controlled Outcome: Progressing   Problem: Elimination: Goal: Will not experience complications related to urinary retention Outcome: Progressing   Problem: Coping: Goal: Level of anxiety will decrease Outcome: Progressing

## 2024-06-06 ENCOUNTER — Inpatient Hospital Stay (HOSPITAL_COMMUNITY): Payer: Self-pay | Admitting: Anesthesiology

## 2024-06-06 ENCOUNTER — Inpatient Hospital Stay (HOSPITAL_COMMUNITY)

## 2024-06-06 ENCOUNTER — Encounter (HOSPITAL_COMMUNITY): Admission: EM | Disposition: A | Discharge status: Home / Self Care | Payer: Self-pay | Source: Home / Self Care

## 2024-06-06 DIAGNOSIS — S82841A Displaced bimalleolar fracture of right lower leg, initial encounter for closed fracture: Secondary | ICD-10-CM

## 2024-06-06 HISTORY — PX: ORIF ANKLE FRACTURE: SHX5408

## 2024-06-06 SURGERY — OPEN REDUCTION INTERNAL FIXATION (ORIF) ANKLE FRACTURE
Anesthesia: General | Site: Ankle | Laterality: Right

## 2024-06-06 MED ORDER — ONDANSETRON HCL 4 MG/2ML IJ SOLN
INTRAMUSCULAR | Status: AC
  Filled 2024-06-06: qty 2

## 2024-06-06 MED ORDER — DOCUSATE SODIUM 100 MG PO CAPS
100.0000 mg | ORAL_CAPSULE | Freq: Two times a day (BID) | ORAL | Status: DC
  Administered 2024-06-06 – 2024-06-07 (×2): 100 mg via ORAL
  Filled 2024-06-06 (×2): qty 1

## 2024-06-06 MED ORDER — ACETAMINOPHEN 10 MG/ML IV SOLN
INTRAVENOUS | Status: AC
  Filled 2024-06-06: qty 100

## 2024-06-06 MED ORDER — MIDAZOLAM HCL 2 MG/2ML IJ SOLN
INTRAMUSCULAR | Status: DC | PRN
  Administered 2024-06-06 (×2): 1 mg via INTRAVENOUS

## 2024-06-06 MED ORDER — CEFAZOLIN SODIUM-DEXTROSE 2-4 GM/100ML-% IV SOLN
INTRAVENOUS | Status: AC
  Filled 2024-06-06: qty 100

## 2024-06-06 MED ORDER — PROPOFOL 10 MG/ML IV BOLUS
INTRAVENOUS | Status: AC
  Filled 2024-06-06: qty 20

## 2024-06-06 MED ORDER — MIDAZOLAM HCL 2 MG/2ML IJ SOLN
INTRAMUSCULAR | Status: AC
  Filled 2024-06-06: qty 2

## 2024-06-06 MED ORDER — FENTANYL CITRATE (PF) 100 MCG/2ML IJ SOLN
INTRAMUSCULAR | Status: DC | PRN
  Administered 2024-06-06 (×2): 50 ug via INTRAVENOUS

## 2024-06-06 MED ORDER — CEFAZOLIN SODIUM-DEXTROSE 2-4 GM/100ML-% IV SOLN
2.0000 g | INTRAVENOUS | Status: AC
  Administered 2024-06-06: 2 g via INTRAVENOUS

## 2024-06-06 MED ORDER — CEFAZOLIN SODIUM-DEXTROSE 2-4 GM/100ML-% IV SOLN: 2.0000 g | Freq: Once | INTRAVENOUS | Status: DC

## 2024-06-06 MED ORDER — DROPERIDOL 2.5 MG/ML IJ SOLN: 0.6250 mg | Freq: Once | INTRAMUSCULAR | Status: DC | PRN

## 2024-06-06 MED ORDER — FENTANYL CITRATE (PF) 50 MCG/ML IJ SOSY: 25.0000 ug | PREFILLED_SYRINGE | INTRAMUSCULAR | Status: DC | PRN

## 2024-06-06 MED ORDER — LACTATED RINGERS IV SOLN: INTRAVENOUS | Status: DC | PRN

## 2024-06-06 MED ORDER — LIDOCAINE HCL (PF) 2 % IJ SOLN
INTRAMUSCULAR | Status: AC
  Filled 2024-06-06: qty 5

## 2024-06-06 MED ORDER — PHENYLEPHRINE 80 MCG/ML (10ML) SYRINGE FOR IV PUSH (FOR BLOOD PRESSURE SUPPORT)
PREFILLED_SYRINGE | INTRAVENOUS | Status: DC | PRN
  Administered 2024-06-06: 160 ug via INTRAVENOUS
  Administered 2024-06-06: 80 ug via INTRAVENOUS
  Administered 2024-06-06: 160 ug via INTRAVENOUS
  Administered 2024-06-06: 80 ug via INTRAVENOUS

## 2024-06-06 MED ORDER — CEFAZOLIN (ANCEF) 1 G IV SOLR: 1.0000 g | INTRAVENOUS | Status: DC

## 2024-06-06 MED ORDER — LIDOCAINE HCL (CARDIAC) PF 100 MG/5ML IV SOSY
PREFILLED_SYRINGE | INTRAVENOUS | Status: DC | PRN
  Administered 2024-06-06: 50 mg via INTRAVENOUS

## 2024-06-06 MED ORDER — ASPIRIN 325 MG PO TABS: 325.0000 mg | ORAL_TABLET | Freq: Every day | ORAL | 0 refills | Status: AC

## 2024-06-06 MED ORDER — CEFAZOLIN SODIUM-DEXTROSE 2-4 GM/100ML-% IV SOLN
2.0000 g | Freq: Four times a day (QID) | INTRAVENOUS | Status: AC
  Administered 2024-06-06 – 2024-06-07 (×3): 2 g via INTRAVENOUS
  Filled 2024-06-06 (×3): qty 100

## 2024-06-06 MED ORDER — 0.9 % SODIUM CHLORIDE (POUR BTL) OPTIME
TOPICAL | Status: DC | PRN
Start: 2024-06-06 — End: 2024-06-06
  Administered 2024-06-06: 1000 mL

## 2024-06-06 MED ORDER — ONDANSETRON HCL 4 MG/2ML IJ SOLN: 4.0000 mg | Freq: Four times a day (QID) | INTRAMUSCULAR | Status: DC | PRN

## 2024-06-06 MED ORDER — OXYCODONE-ACETAMINOPHEN 5-325 MG PO TABS: 1.0000 | ORAL_TABLET | ORAL | 0 refills | Status: AC | PRN

## 2024-06-06 MED ORDER — ONDANSETRON HCL 4 MG PO TABS: 4.0000 mg | ORAL_TABLET | Freq: Four times a day (QID) | ORAL | Status: DC | PRN

## 2024-06-06 MED ORDER — FENTANYL CITRATE (PF) 250 MCG/5ML IJ SOLN
INTRAMUSCULAR | Status: AC
  Filled 2024-06-06: qty 5

## 2024-06-06 MED ORDER — FLEET ENEMA RE ENEM: 1.0000 | ENEMA | Freq: Once | RECTAL | Status: DC | PRN

## 2024-06-06 MED ORDER — PROPOFOL 10 MG/ML IV BOLUS
INTRAVENOUS | Status: DC | PRN
  Administered 2024-06-06: 110 mg via INTRAVENOUS

## 2024-06-06 MED ORDER — POLYETHYLENE GLYCOL 3350 17 G PO PACK: 17.0000 g | PACK | Freq: Every day | ORAL | Status: DC | PRN

## 2024-06-06 MED ORDER — SODIUM CHLORIDE 0.9 % IV SOLN: INTRAVENOUS | Status: AC

## 2024-06-06 MED ORDER — ONDANSETRON HCL 4 MG/2ML IJ SOLN
INTRAMUSCULAR | Status: DC | PRN
  Administered 2024-06-06: 4 mg via INTRAVENOUS

## 2024-06-06 MED ORDER — ASPIRIN 325 MG PO TABS
325.0000 mg | ORAL_TABLET | Freq: Every day | ORAL | Status: DC
Start: 2024-06-06 — End: 2024-06-07
  Administered 2024-06-06 – 2024-06-07 (×2): 325 mg via ORAL
  Filled 2024-06-06 (×2): qty 1

## 2024-06-06 MED ORDER — BISACODYL 10 MG RE SUPP: 10.0000 mg | Freq: Every day | RECTAL | Status: DC | PRN

## 2024-06-06 MED ORDER — DEXAMETHASONE SOD PHOSPHATE PF 10 MG/ML IJ SOLN
INTRAMUSCULAR | Status: DC | PRN
  Administered 2024-06-06: 5 mg via INTRAVENOUS

## 2024-06-06 SURGICAL SUPPLY — 53 items
BAG COUNTER SPONGE SURGICOUNT (BAG) IMPLANT
BAG ZIPLOCK 12X15 (MISCELLANEOUS) ×1 IMPLANT
BIT DRILL 2.9 CANN QC NONSTRL (BIT) IMPLANT
BIT DRILL LONG 2.5 (BIT) IMPLANT
BNDG COHESIVE 4X5 TAN STRL LF (GAUZE/BANDAGES/DRESSINGS) ×1 IMPLANT
BNDG COHESIVE 6X5 TAN ST LF (GAUZE/BANDAGES/DRESSINGS) ×1 IMPLANT
BNDG ELASTIC 4INX 5YD STR LF (GAUZE/BANDAGES/DRESSINGS) ×1 IMPLANT
BNDG ELASTIC 6INX 5YD STR LF (GAUZE/BANDAGES/DRESSINGS) ×1 IMPLANT
BNDG ELASTIC 6X10 VLCR STRL LF (GAUZE/BANDAGES/DRESSINGS) IMPLANT
COVER SURGICAL LIGHT HANDLE (MISCELLANEOUS) ×1 IMPLANT
CUFF TRNQT CYL 34X4.125X (TOURNIQUET CUFF) ×1 IMPLANT
DRAPE C-ARM 42X120 X-RAY (DRAPES) IMPLANT
DRAPE EXTREMITY T 121X128X90 (DISPOSABLE) ×1 IMPLANT
DRAPE OEC MINIVIEW 54X84 (DRAPES) ×1 IMPLANT
DRAPE TOWEL STERILE LF 18X12 (DRAPES) ×1 IMPLANT
DRIVER RETENTION T15 LONG (ORTHOPEDIC DISPOSABLE SUPPLIES) IMPLANT
DRSG ADAPTIC 3X8 NADH LF (GAUZE/BANDAGES/DRESSINGS) IMPLANT
DURAPREP 26ML APPLICATOR (WOUND CARE) ×1 IMPLANT
ELECT PENCIL ROCKER SW 15FT (MISCELLANEOUS) ×1 IMPLANT
ELECT REM PT RETURN 15FT ADLT (MISCELLANEOUS) ×1 IMPLANT
GAUZE PAD ABD 8X10 STRL (GAUZE/BANDAGES/DRESSINGS) ×2 IMPLANT
GAUZE SPONGE 4X4 12PLY STRL (GAUZE/BANDAGES/DRESSINGS) ×1 IMPLANT
GAUZE XEROFORM 5X9 LF (GAUZE/BANDAGES/DRESSINGS) IMPLANT
GLOVE BIO SURGEON STRL SZ8 (GLOVE) ×2 IMPLANT
GLOVE BIOGEL PI IND STRL 8 (GLOVE) ×2 IMPLANT
GOWN STRL REUS W/ TWL XL LVL3 (GOWN DISPOSABLE) ×2 IMPLANT
KIT BASIN OR (CUSTOM PROCEDURE TRAY) ×1 IMPLANT
KIT TURNOVER KIT A (KITS) ×1 IMPLANT
KWIRE ALPS MXV 1.6X6 ZI (WIRE) IMPLANT
NS IRRIG 1000ML POUR BTL (IV SOLUTION) ×1 IMPLANT
PACK TOTAL JOINT (CUSTOM PROCEDURE TRAY) ×1 IMPLANT
PAD CAST 4YDX4 CTTN HI CHSV (CAST SUPPLIES) ×2 IMPLANT
PADDING CAST COTTON 6X4 STRL (CAST SUPPLIES) ×1 IMPLANT
PLATE LAT FIB 6H RT (Plate) IMPLANT
PROTECTOR NERVE ULNAR (MISCELLANEOUS) ×1 IMPLANT
SCREW ACE CAN 4.0 50M (Screw) IMPLANT
SCREW LOCK 3.5X10 (Screw) IMPLANT
SCREW LOCK MDS 3.5X12 (Screw) IMPLANT
SCREW NON-LOCK 3.5X10 (Screw) IMPLANT
SCREW NON-LOCK 3.5X12 (Screw) IMPLANT
SPIKE FLUID TRANSFER (MISCELLANEOUS) ×1 IMPLANT
STAPLER SKIN PROX 35W (STAPLE) IMPLANT
STRAP ANKLE DISTRACTOR (MISCELLANEOUS) IMPLANT
STRIP CLOSURE SKIN 1/2X4 (GAUZE/BANDAGES/DRESSINGS) IMPLANT
SUCTION TUBE FRAZIER 10FR DISP (SUCTIONS) ×1 IMPLANT
SUT MNCRL AB 4-0 PS2 18 (SUTURE) ×1 IMPLANT
SUT PROLENE 3 0 PS 2 (SUTURE) ×1 IMPLANT
SUT VIC AB 0 CT1 36 (SUTURE) ×1 IMPLANT
SUT VIC AB 1 CT1 36 (SUTURE) IMPLANT
SUT VIC AB 2-0 CT1 TAPERPNT 27 (SUTURE) ×1 IMPLANT
SUT VIC AB 3-0 PS2 18XBRD (SUTURE) IMPLANT
SYR CONTROL 10ML LL (SYRINGE) ×1 IMPLANT
WATER STERILE IRR 1000ML POUR (IV SOLUTION) ×1 IMPLANT

## 2024-06-06 NOTE — Plan of Care (Signed)
 Patient admission education progressing

## 2024-06-06 NOTE — Anesthesia Procedure Notes (Signed)
 Procedure Name: LMA Insertion Date/Time: 06/06/2024 7:54 AM  Performed by: Dasie Nena PARAS, CRNAPre-anesthesia Checklist: Patient identified, Emergency Drugs available, Suction available, Patient being monitored and Timeout performed Patient Re-evaluated:Patient Re-evaluated prior to induction Oxygen Delivery Method: Circle system utilized Preoxygenation: Pre-oxygenation with 100% oxygen Induction Type: IV induction Ventilation: Mask ventilation without difficulty LMA: LMA inserted LMA Size: 4.0 Number of attempts: 1 Placement Confirmation: positive ETCO2 and breath sounds checked- equal and bilateral Tube secured with: Tape Dental Injury: Teeth and Oropharynx as per pre-operative assessment

## 2024-06-06 NOTE — Op Note (Signed)
 06/06/2024  8:57 AM  PATIENT:  Michele Ross  [birth year] female   MEDICAL RECORD NUMBER: [id number]  PRE-OPERATIVE DIAGNOSIS:  Right ankle fracture  POST-OPERATIVE DIAGNOSIS:  Right ankle fracture  PROCEDURE:   OPEN REDUCTION INTERNAL FIXATION OF RIGHT BIMALLEOLAR ANKLE FRACTURE   SURGEON:  Cordella Rhein, M.D.  ASSISTANT: None  ANESTHESIA:  General.  COMPLICATIONS:  None.  TOURNIQUET: None.  ESTIMATED BLOOD LOSS: None, tourniquet time 45 minutes  DISPOSITION:  To PACU.  CONDITION:  Stable.    BRIEF SUMMARY AND INDICATIONS FOR PROCEDURE:  The patient is a 63 y.o. who sustained a fracture dislocation of the ankle, treated with reduction at the time of presentation to the Emergency Department, followed  by splint application. Patient subsequently seen in the office to reassess soft tissues, which now show sufficient swelling resolution to allow for surgical repair.  I discussed with the patient the risks and benefits of surgery including the possibility of infection, DVT, PE, nerve injury, vessel injury, loss of motion, arthritis, symptomatic hardware, heart attack, stroke and need for further surgery, among others. We specifically discussed syndesmotic repair and that some of the implants used for this would likely require subsequent removal. After acknowledging these risks, consent was provided to proceed.  SUMMARY OF PROCEDURE:  The patient was taken to the operating room after administration of a regional block and preoperative antibiotics.  The right lower extremity was prepped and draped in the usual sterile fashion.   A timeout was held, A tourniquet was placed about the thigh and inflated to 300 mgHg.   An incision was made directly over the lateral malleolus with careful dissection to avoid injury to the superficial peroneal nerve.  The periosteum was left intact as we continued deep dissection.  The fracture site was identified and curettage and lavage used to remove  hematoma.  With the assistance of distal manipulation and placement of tenaculums, we were able to obtain an anatomic reduction with  interdigitation of the primary fracture fragments.  Fracture shows significant comminution and was not felt to be a possibility of doing a lag screw.  Therefore elected for bridging plate.  Again the periosteum was left intact.  We used a close reduction technique.  A [employer] distal fibular locking plate was selected.  This initially fixed distally using a cortical screw.  A K wire was then placed into the proximal fibula.  Multiple locking screws were then placed into the distal fragment.  3 cortical screws were then placed in the proximal fragment.  Fluoroscopic images Demix acceptable alignment of the fracture and of all hardware.   Next, attention was turned to the medial side.   Manual reduction was held  Two percutaneous K wires were place.  A small incision was made each k-wire.  A drill was used for the near cortex.  Two partially threaded screws at 50 mm.  Excellent compression was obtained.  The C-arm was then brought back in and AP, lateral and mortise views showed restoration of ankle alignment reduction.    I then performed an external rotation stress view of the ankle under live fluoroscopy.  No syndesmotic widening nor widening of the medial clear space was identified to suggest a syndesmotic injury.  Wounds were irrigated once more and then closed in standard layered fashion using 2-0 Vicryl and staples.  A sterile gently compressive dressing was applied, and then a posterior and stirrup splint with the ankle extended just above neutral.   PROGNOSIS: The patient will  be nonweightbearing in the splint with ice and elevation over the next 3 to 5 days.  We will plan to see her back in the office in 10-14 days for removal of sutures and transition to a Cam boot with unrestricted range of motion of the  ankle at that time.  Weightbearing at 6 weeks.

## 2024-06-06 NOTE — Anesthesia Procedure Notes (Signed)
 Anesthesia Regional Block: Adductor canal block   Pre-Anesthetic Checklist: , timeout performed,  Correct Patient, Correct Site, Correct Laterality,  Correct Procedure, Correct Position, site marked,  Risks and benefits discussed,  Surgical consent,  Pre-op evaluation,  At surgeon's request and post-op pain management  Laterality: Lower and Right  Prep: chloraprep       Needles:  Injection technique: Single-shot  Needle Type: Stimiplex     Needle Length: 9cm  Needle Gauge: 21     Additional Needles:   Procedures:,,,, ultrasound used (permanent image in chart),,    Narrative:  Start time: 06/06/2024 7:17 AM End time: 06/06/2024 7:32 AM Injection made incrementally with aspirations every 5 mL.  Performed by: Personally  Anesthesiologist: Darlyn Rush, MD  Additional Notes: BP cuff, EKG monitors applied. Sedation begun. Artery and nerve location verified with ultrasound. Anesthetic injected incrementally (5ml), slowly, and after negative aspirations under direct u/s guidance. Good fascial/perineural spread. Tolerated well.

## 2024-06-06 NOTE — Anesthesia Postprocedure Evaluation (Signed)
 Anesthesia Post Note  Patient: Michele Ross  Procedure(s) Performed: OPEN REDUCTION INTERNAL FIXATION (ORIF) ANKLE FRACTURE (Right: Ankle)     Patient location during evaluation: PACU Anesthesia Type: General Level of consciousness: sedated and patient cooperative Pain management: pain level controlled Vital Signs Assessment: post-procedure vital signs reviewed and stable Respiratory status: spontaneous breathing Cardiovascular status: stable Anesthetic complications: no   No notable events documented.  Last Vitals:  Vitals:   06/06/24 0954 06/06/24 1011  BP: (!) 100/52 (!) 113/55  Pulse: 64 67  Resp: 14 16  Temp:  36.5 C  SpO2: 99% 98%    Last Pain:  Vitals:   06/06/24 1011  TempSrc: Oral  PainSc: 0-No pain                 Norleen Pope

## 2024-06-06 NOTE — Anesthesia Procedure Notes (Signed)
 Anesthesia Regional Block: Popliteal block   Pre-Anesthetic Checklist: , timeout performed,  Correct Patient, Correct Site, Correct Laterality,  Correct Procedure, Correct Position, site marked,  Risks and benefits discussed,  Surgical consent,  Pre-op evaluation,  At surgeon's request and post-op pain management  Laterality: Lower and Right  Prep: chloraprep       Needles:  Injection technique: Single-shot  Needle Type: Stimiplex     Needle Length: 10cm  Needle Gauge: 21     Additional Needles:   Procedures:,,,, ultrasound used (permanent image in chart),,   Motor weakness within 5 minutes.  Narrative:  Start time: 06/06/2024 7:32 AM End time: 06/06/2024 7:37 AM Injection made incrementally with aspirations every 5 mL.  Performed by: Personally  Anesthesiologist: Darlyn Rush, MD  Additional Notes: Nerve located and needle positioned with direct ultrasound guidance. Good perineural spread. Patient tolerated well.

## 2024-06-06 NOTE — Progress Notes (Signed)
 The patient is seen in the preoperative holding area.  Her right leg is still in a splint.  She still has good sensation of the toes and good movement of the toes.  There are wrinkles in the skin.  Will plan for surgery as previously discussed.  This will be an open reduction internal fixation of the right by mall ankle fracture.  We again reviewed the procedure all questions were answered.  The right leg was marked.

## 2024-06-06 NOTE — Discharge Summary (Signed)
 Physician Discharge Summary  Patient ID: Michele Ross MRN: [id number] DOB/AGE: 12-25-1960 63 y.o.  Admit date: 06/04/2024 Discharge date: 06/06/2024  Admission Diagnoses:  Discharge Diagnoses:  Principal Problem:   Bimalleolar ankle fracture, right, closed, initial encounter   Discharged Condition: good  Hospital Course: Patient was seen in the emergency room on 06/05/2024.  She had fallen injuring her right ankle.  She was admitted to the floor.  She was taken to the operating on 06/06/2024 for surgical fixation.  Please see the operative note.  Following surgery her pain is controlled with oral agents.  She was given aspirin  for DVT prophylaxis.  She was placed on bowel regimen.  She was seen by physical Occupational Therapy and cleared for discharge once clearing therapy.  Consults: None   Discharge Exam: Blood pressure (!) 100/49, pulse 69, temperature (!) 97.5 F (36.4 C), resp. rate (!) 8, height 5' 5 (1.651 m), weight 59.3 kg, SpO2 100%. Well-appearing woman no acute distress.  Splint on the right leg.  Limited movement of the toes and minimal sensation as the postoperative pain block does not affect   disposition:  Home     Signed: Cordella SHAUNNA Rhein 06/06/2024, 9:03 AM

## 2024-06-06 NOTE — Transfer of Care (Signed)
 Immediate Anesthesia Transfer of Care Note  Patient: Michele Ross  Procedure(s) Performed: OPEN REDUCTION INTERNAL FIXATION (ORIF) ANKLE FRACTURE (Right: Ankle)  Patient Location: PACU  Anesthesia Type:GA combined with regional for post-op pain  Level of Consciousness: sedated  Airway & Oxygen Therapy: Patient Spontanous Breathing and Patient connected to face mask oxygen  Post-op Assessment: Report given to RN and Post -op Vital signs reviewed and stable  Post vital signs: Reviewed and stable  Last Vitals:  Vitals Value Taken Time  BP 96/48 06/06/24 08:51  Temp 36.4 C 06/06/24 08:51  Pulse 59 06/06/24 08:55  Resp 7 06/06/24 08:55  SpO2 100 % 06/06/24 08:55  Vitals shown include unfiled device data.  Last Pain:  Vitals:   06/06/24 0851  TempSrc:   PainSc: Asleep      Patients Stated Pain Goal: 2 (06/05/24 2251)  Complications: No notable events documented.

## 2024-06-06 NOTE — Discharge Instructions (Signed)
 Orthopaedic Discharge Instructions   General Discharge Instructions  WEIGHT BEARING STATUS: Nonweightbearing right leg  RANGE OF MOTION/ACTIVITY: Keep splint in place  Wound Care: Keep splint in place until follow-up  DVT/PE prophylaxis: Aspirin   Diet: as you were eating previously.  Can use over the counter stool softeners and bowel preparations, such as Miralax, to help with bowel movements.  Narcotics can be constipating.  Be sure to drink plenty of fluids  PAIN MEDICATION USE AND EXPECTATIONS  You have likely been given narcotic medications to help control your pain.  After a traumatic event that results in an fracture (broken bone) with or without surgery, it is ok to use narcotic pain medications to help control one's pain.  We understand that everyone responds to pain differently and each individual patient will be evaluated on a regular basis for the continued need for narcotic medications. Ideally, narcotic medication use should last no more than 6-8 weeks (coinciding with fracture healing).   As a patient it is your responsibility as well to monitor narcotic medication use and report the amount and frequency you use these medications when you come to your office visit.   We would also advise that if you are using narcotic medications, you should take a dose prior to therapy to maximize you participation.  IF YOU ARE ON NARCOTIC MEDICATIONS IT IS NOT PERMISSIBLE TO OPERATE A MOTOR VEHICLE (MOTORCYCLE/CAR/TRUCK/MOPED) OR HEAVY MACHINERY DO NOT MIX NARCOTICS WITH OTHER CNS (CENTRAL NERVOUS SYSTEM) DEPRESSANTS SUCH AS ALCOHOL  POST-OPERATIVE OPIOID TAPER INSTRUCTIONS: It is important to wean off of your opioid medication as soon as possible. If you do not need pain medication after your surgery it is ok to stop day one. Opioids include: Codeine, Hydrocodone(Norco, Vicodin), Oxycodone(Percocet, oxycontin) and hydromorphone amongst others.  Long term and even short term use of opiods  can cause: Increased pain response Dependence Constipation Depression Respiratory depression And more.  Withdrawal symptoms can include Flu like symptoms Nausea, vomiting And more Techniques to manage these symptoms Hydrate well Eat regular healthy meals Stay active Use relaxation techniques(deep breathing, meditating, yoga) Do Not substitute Alcohol to help with tapering If you have been on opioids for less than two weeks and do not have pain than it is ok to stop all together.  Plan to wean off of opioids This plan should start within one week post op of your fracture surgery  Maintain the same interval or time between taking each dose and first decrease the dose.  Cut the total daily intake of opioids by one tablet each day Next start to increase the time between doses. The last dose that should be eliminated is the evening dose.    STOP SMOKING OR USING NICOTINE PRODUCTS!!!!  As discussed nicotine severely impairs your body's ability to heal surgical and traumatic wounds but also impairs bone healing.  Wounds and bone heal by forming microscopic blood vessels (angiogenesis) and nicotine is a vasoconstrictor (essentially, shrinks blood vessels).  Therefore, if vasoconstriction occurs to these microscopic blood vessels they essentially disappear and are unable to deliver necessary nutrients to the healing tissue.  This is one modifiable factor that you can do to dramatically increase your chances of healing your injury.  (This means no smoking, no nicotine gum, patches, etc)  DO NOT USE NONSTEROIDAL ANTI-INFLAMMATORY DRUGS (NSAID'S)  Using products such as Advil  (ibuprofen ), Aleve (naproxen), Motrin  (ibuprofen ) for additional pain control during fracture healing can delay and/or prevent the healing response.  If you would like to take  over the counter (OTC) medication, Tylenol (acetaminophen) is ok.  However, some narcotic medications that are given for pain control contain  acetaminophen as well. Therefore, you should not exceed more than 4000 mg of tylenol in a day if you do not have liver disease.  Also note that there are may OTC medicines, such as cold medicines and allergy medicines that my contain tylenol as well.  If you have any questions about medications and/or interactions please ask your doctor/PA or your pharmacist.      ICE AND ELEVATE INJURED/OPERATIVE EXTREMITY  Using ice and elevating the injured extremity above your heart can help with swelling and pain control.  Icing in a pulsatile fashion, such as 20 minutes on and 20 minutes off, can be followed.    Do not place ice directly on skin. Make sure there is a barrier between to skin and the ice pack.    Using frozen items such as frozen peas works well as the conform nicely to the are that needs to be iced.  USE AN ACE WRAP OR TED HOSE FOR SWELLING CONTROL  In addition to icing and elevation, Ace wraps or TED hose are used to help limit and resolve swelling.  It is recommended to use Ace wraps or TED hose until you are informed to stop.    When using Ace Wraps start the wrapping distally (farthest away from the body) and wrap proximally (closer to the body)   Example: If you had surgery on your leg or thing and you do not have a splint on, start the ace wrap at the toes and work your way up to the thigh        If you had surgery on your upper extremity and do not have a splint on, start the ace wrap at your fingers and work your way up to the upper arm  IF YOU ARE IN A SPLINT OR CAST DO NOT REMOVE IT FOR ANY REASON   If your splint gets wet for any reason please contact the office immediately. You may shower in your splint or cast as long as you keep it dry.  This can be done by wrapping in a cast cover or garbage back (or similar)  Do Not stick any thing down your splint or cast such as pencils, money, or hangers to try and scratch yourself with.  If you feel itchy take benadryl as prescribed on the  bottle for itching  IF YOU ARE IN A CAM BOOT (BLACK BOOT)  You may remove boot periodically. Perform daily dressing changes as noted below.  Wash the liner of the boot regularly and wear a sock when wearing the boot. It is recommended that you sleep in the boot until told otherwise   CALL THE OFFICE FOR MEDICATION REFILLS OR WITH ANY QUESTIONS/CONCERNS: [phone]     Call office for the following: Temperature greater than 101F Persistent nausea and vomiting Severe uncontrolled pain Redness, tenderness, or signs of infection (pain, swelling, redness, odor or green/yellow discharge around the site) Difficulty breathing, headache or visual disturbances Hives Persistent dizziness or light-headedness Extreme fatigue Any other questions or concerns you may have after discharge  In an emergency, call 911 or go to an Emergency Department at a nearby hospital  OTHER HELPFUL INFORMATION  If you had a block, it will wear off between 8-24 hrs postop typically.  This is period when your pain may go from nearly zero to the pain you would have had postop  without the block.  This is an abrupt transition but nothing dangerous is happening.  You may take an extra dose of narcotic when this happens.  You should wean off your narcotic medicines as soon as you are able.  Most patients will be off or using minimal narcotics before their first postop appointment.   We suggest you use the pain medication the first night prior to going to bed, in order to ease any pain when the anesthesia wears off. You should avoid taking pain medications on an empty stomach as it will make you nauseous.  Do not drink alcoholic beverages or take illicit drugs when taking pain medications.  In most states it is against the law to drive while you are in a splint or sling.  And certainly against the law to drive while taking narcotics.  You may return to work/school in the next couple of days when you feel up to it.    Pain medication may make you constipated.  Below are a few solutions to try in this order: Decrease the amount of pain medication if you aren't having pain. Drink lots of decaffeinated fluids. Drink prune juice and/or each dried prunes  If the first 3 don't work start with additional solutions Take Colace - an over-the-counter stool softener Take Senokot - an over-the-counter laxative Take Miralax - a stronger over-the-counter laxative   Follow up with Dr Reyne in 2 weeks  Cordella Reyne, MD, MS [facility] [phone]

## 2024-06-07 LAB — BASIC METABOLIC PANEL WITH GFR
Anion gap: 9 (ref 5–15)
BUN: 8 mg/dL (ref 8–23)
CO2: 25 mmol/L (ref 22–32)
Calcium: 8.7 mg/dL — ABNORMAL LOW (ref 8.9–10.3)
Chloride: 107 mmol/L (ref 98–111)
Creatinine, Ser: 0.49 mg/dL (ref 0.44–1.00)
GFR, Estimated: >60 mL/min (ref >60)
Glucose, Bld: 109 mg/dL — ABNORMAL HIGH (ref 70–99)
Potassium: 3.8 mmol/L (ref 3.5–5.1)
Sodium: 141 mmol/L (ref 135–145)

## 2024-06-07 LAB — CBC
HCT: 34.3 % — ABNORMAL LOW (ref 36.0–46.0)
Hemoglobin: 10.8 g/dL — ABNORMAL LOW (ref 12.0–15.0)
MCH: 30.3 pg (ref 26.0–34.0)
MCHC: 31.5 g/dL (ref 30.0–36.0)
MCV: 96.1 fL (ref 80.0–100.0)
Platelets: 194 K/uL (ref 150–400)
RBC: 3.57 MIL/uL — ABNORMAL LOW (ref 3.87–5.11)
RDW: 13.7 % (ref 11.5–15.5)
WBC: 12.4 K/uL — ABNORMAL HIGH (ref 4.0–10.5)
nRBC: 0 % (ref 0.0–0.2)

## 2024-06-07 MED ORDER — DOCUSATE SODIUM 100 MG PO CAPS: 100.0000 mg | ORAL_CAPSULE | Freq: Two times a day (BID) | ORAL | 0 refills | Status: AC

## 2024-06-07 NOTE — Evaluation (Signed)
 Occupational Therapy Evaluation Patient Details Name: Michele Ross MRN: [id number] DOB: 23-Jul-1961 Today's Date: 06/07/2024   History of Present Illness   Pt is a 63 y.o. female presenting to [facility] ED after fall while playing with dog, c/o R ankle pain with displaced bimalleolar ankle fx. Pt is now s/p R ankle ORIF on 10/11 and NWB.     Clinical Impressions Pt admitted with above. Prior to admission, pt was very active, independent with ADLs and mobility. Spouse will be assisting pt at discharge. Pt was able to verbalize and adhere to NWB RLE precautions during transfers and mobility, relying heavily on UE support to perform hopping step pattern with RW. Mod independent for bed mobility, functional STS transfers with CGA and cues for technique, and ~60 feet mobility in hallway with close chair follow for safety. Edu pt on use of BSC for improved safety and precaution adherence with visual demo provided, pt verbalizes understanding. Pt would benefit from skilled OT services to address noted impairments and functional limitations (see below for any additional details) in order to maximize safety and independence while minimizing falls risk and caregiver burden. Do not anticipate the need for follow up OT services upon acute hospital DC.      If plan is discharge home, recommend the following:   A little help with walking and/or transfers;A little help with bathing/dressing/bathroom;Assist for transportation;Help with stairs or ramp for entrance     Functional Status Assessment   Patient has had a recent decline in their functional status and demonstrates the ability to make significant improvements in function in a reasonable and predictable amount of time.     Equipment Recommendations   BSC/3in1 (& RW)      Precautions/Restrictions   Precautions Precautions: Fall Recall of Precautions/Restrictions: Intact Restrictions Weight Bearing Restrictions Per Provider Order:  Yes RLE Weight Bearing Per Provider Order: Non weight bearing     Mobility Bed Mobility Overal bed mobility: Modified Independent                  Transfers Overall transfer level: Needs assistance Equipment used: Rolling walker (2 wheels) Transfers: Sit to/from Stand Sit to Stand: Contact guard assist, +2 safety/equipment           General transfer comment: +2 for safety and close chair follow, cues for UE reliance      Balance Overall balance assessment: Needs assistance Sitting-balance support: No upper extremity supported, Feet supported Sitting balance-Leahy Scale: Good     Standing balance support: Bilateral upper extremity supported Standing balance-Leahy Scale: Poor                             ADL either performed or assessed with clinical judgement   ADL Overall ADL's : Needs assistance/impaired Eating/Feeding: Independent;Sitting   Grooming: Sitting;Independent   Upper Body Bathing: Sitting;Set up   Lower Body Bathing: Minimal assistance;Sit to/from stand   Upper Body Dressing : Sitting;Set up       Toilet Transfer: Contact guard assist;Ambulation;BSC/3in1;Rolling walker (2 wheels) Toilet Transfer Details (indicate cue type and reason): visual demo of transfer using BSC over standard commode, anticipate pt able to perform with CGA and RW Toileting- Clothing Manipulation and Hygiene: Contact guard assist;Sitting/lateral lean;Sit to/from stand       Functional mobility during ADLs: Contact guard assist;Rolling walker (2 wheels) General ADL Comments: functional mobility in hallway with close chair folllow, edu on toilet transfers and use of BSC  Vision Baseline Vision/History: 1 Wears glasses Ability to See in Adequate Light: 0 Adequate Patient Visual Report: No change from baseline Vision Assessment?: Wears glasses for reading;Wears glasses for driving            Pertinent Vitals/Pain Pain Assessment Pain Assessment:  Faces Pain Score: 2  Faces Pain Scale: Hurts a little bit Pain Location: R ankle Pain Descriptors / Indicators: Aching Pain Intervention(s): Limited activity within patient's tolerance, Monitored during session, Premedicated before session     Extremity/Trunk Assessment Upper Extremity Assessment Upper Extremity Assessment: Overall WFL for tasks assessed;Right hand dominant   Lower Extremity Assessment Lower Extremity Assessment: Defer to PT evaluation RLE Deficits / Details: splint in place at ankle; ROM/strength intact hip and knee   Cervical / Trunk Assessment Cervical / Trunk Assessment: Normal   Communication Communication Communication: No apparent difficulties   Cognition Arousal: Alert Behavior During Therapy: WFL for tasks assessed/performed Cognition: No apparent impairments                               Following commands: Intact       Cueing  General Comments   Cueing Techniques: Verbal cues  RLE splint in place, VSS on RA           Home Living Family/patient expects to be discharged to:: Private residence Living Arrangements: Spouse/significant other Available Help at Discharge: Family;Available 24 hours/day Type of Home: House Home Access: Stairs to enter [employer] of Steps: 1+1 Entrance Stairs-Rails: None Home Layout: One level     Bathroom Shower/Tub: Producer, television/film/video: Standard     Home Equipment: None          Prior Functioning/Environment Prior Level of Function : Independent/Modified Independent                    OT Problem List: Decreased range of motion;Decreased activity tolerance;Impaired balance (sitting and/or standing);Decreased knowledge of use of DME or AE;Decreased knowledge of precautions   OT Treatment/Interventions: Patient/family education;Balance training;Self-care/ADL training;Therapeutic exercise;DME and/or AE instruction;Therapeutic activities      OT  Goals(Current goals can be found in the care plan section)   Acute Rehab OT Goals OT Goal Formulation: With patient Time For Goal Achievement: 06/21/24 Potential to Achieve Goals: Good   OT Frequency:  Min 2X/week    Co-evaluation PT/OT/SLP Co-Evaluation/Treatment: Yes Reason for Co-Treatment: For patient/therapist safety;To address functional/ADL transfers PT goals addressed during session: Mobility/safety with mobility;Balance OT goals addressed during session: ADL's and self-care;Proper use of [employer] equipment and DME      AM-PAC OT 6 Clicks Daily Activity     Outcome Measure Help from another person eating meals?: None Help from another person taking care of personal grooming?: None Help from another person toileting, which includes using toliet, bedpan, or urinal?: A Little Help from another person bathing (including washing, rinsing, drying)?: A Little Help from another person to put on and taking off regular upper body clothing?: None Help from another person to put on and taking off regular lower body clothing?: A Little 6 Click Score: 21   End of Session Equipment Utilized During Treatment: Gait belt;Rolling walker (2 wheels) Nurse Communication: Mobility status  Activity Tolerance: Patient tolerated treatment well;No increased pain Patient left: in chair;with call bell/phone within reach;with chair alarm set  OT Visit Diagnosis: History of falling (Z91.81);Other abnormalities of gait and mobility (R26.89)  Time: 9075-9054 OT Time Calculation (min): 21 min Charges:  OT General Charges $OT Visit: 1 Visit OT Evaluation $OT Eval Low Complexity: 1 Low Gunner Iodice L. Fate Galanti, OTR/L  06/07/24, 1:18 PM

## 2024-06-07 NOTE — Progress Notes (Signed)
 Physical Therapy Treatment Patient Details Name: Michele Ross MRN: [id number] DOB: 1961-06-01 Today's Date: 06/07/2024   History of Present Illness Pt s/p fall with R ankle fx and now s/p ORIF.  Pt with hx of osteoporosis    PT Comments  Pt continues very motivated and progressing well with mobility with noted improvement in stability with gait and pt up to negotiate stairs to allow entry to home.  Pt hopeful for dc home this date.    If plan is discharge home, recommend the following: A little help with walking and/or transfers;A little help with bathing/dressing/bathroom;Assistance with cooking/housework;Help with stairs or ramp for entrance;Assist for transportation   Can travel by private vehicle        Equipment Recommendations  Rolling walker (2 wheels);BSC/3in1    Recommendations for Other Services       Precautions / Restrictions Precautions Precautions: Fall Restrictions Weight Bearing Restrictions Per Provider Order: Yes RLE Weight Bearing Per Provider Order: Non weight bearing     Mobility  Bed Mobility Overal bed mobility: Modified Independent                  Transfers Overall transfer level: Needs assistance Equipment used: Rolling walker (2 wheels) Transfers: Sit to/from Stand Sit to Stand: Supervision           General transfer comment: cues for LE management and use of UEs to self assist    Ambulation/Gait Ambulation/Gait assistance: Contact guard assist, Supervision Gait Distance (Feet): 32 Feet Assistive device: Rolling walker (2 wheels) Gait Pattern/deviations: Step-to pattern, Decreased step length - right, Decreased step length - left, Shuffle, Trunk flexed Gait velocity: cues to slow pace for stability     General Gait Details: cues for posture, position from RW, noted improvement with stability/pace   Stairs Stairs: Yes Stairs assistance: Min assist Stair Management: No rails, Step to pattern, Backwards, With  walker Number of Stairs: 4 General stair comments: single step x 4 with cues for sequence and foot/RW placement   Wheelchair Mobility     Tilt Bed    Modified Rankin (Stroke Patients Only)       Balance Overall balance assessment: Needs assistance Sitting-balance support: No upper extremity supported, Feet supported Sitting balance-Leahy Scale: Good     Standing balance support: Bilateral upper extremity supported Standing balance-Leahy Scale: Poor                              Communication Communication Communication: No apparent difficulties  Cognition Arousal: Alert Behavior During Therapy: WFL for tasks assessed/performed   PT - Cognitive impairments: No apparent impairments                         Following commands: Intact      Cueing Cueing Techniques: Verbal cues  Exercises      General Comments        Pertinent Vitals/Pain Pain Assessment Pain Assessment: Faces Pain Score: 2  Pain Location: R ankle Pain Descriptors / Indicators: Aching Pain Intervention(s): Premedicated before session, Monitored during session, Limited activity within patient's tolerance    Home Living Family/patient expects to be discharged to:: Private residence Living Arrangements: Spouse/significant other Available Help at Discharge: Family;Available 24 hours/day Type of Home: House Home Access: Stairs to enter Entrance Stairs-Rails: None Entrance Stairs-Number of Steps: 1+1   Home Layout: One level Home Equipment: None      Prior Function  PT Goals (current goals can now be found in the care plan section) Acute Rehab PT Goals Patient Stated Goal: Regain IND PT Goal Formulation: With patient Time For Goal Achievement: 06/14/24 Potential to Achieve Goals: Good Progress towards PT goals: Progressing toward goals    Frequency    7X/week      PT Plan      Co-evaluation              AM-PAC PT 6 Clicks Mobility    Outcome Measure  Help needed turning from your back to your side while in a flat bed without using bedrails?: None Help needed moving from lying on your back to sitting on the side of a flat bed without using bedrails?: None Help needed moving to and from a bed to a chair (including a wheelchair)?: A Little Help needed standing up from a chair using your arms (e.g., wheelchair or bedside chair)?: A Little Help needed to walk in hospital room?: A Little Help needed climbing 3-5 steps with a railing? : A Little 6 Click Score: 20    End of Session Equipment Utilized During Treatment: Gait belt Activity Tolerance: Patient tolerated treatment well Patient left: in bed;with call bell/phone within reach;with bed alarm set Nurse Communication: Mobility status PT Visit Diagnosis: Unsteadiness on feet (R26.81);Difficulty in walking, not elsewhere classified (R26.2)     Time: 1035-1050 PT Time Calculation (min) (ACUTE ONLY): 15 min  Charges:    $Gait Training: 8-22 mins PT General Charges $$ ACUTE PT VISIT: 1 Visit                     [facility] PT Acute Rehabilitation Services Office [phone]    Saleemah Mollenhauer 06/07/2024, 12:26 PM

## 2024-06-07 NOTE — Discharge Planning (Signed)
 Spoke with Ada at [employer] regarding patient's BSC and Walker. She is now on their list for distrubution. Updated bedside RN Seria via Secure Chat.  ALEC Lim, BSN, RN [facility]

## 2024-06-07 NOTE — Plan of Care (Signed)
 Admission education complete and adequate for discharge.

## 2024-06-07 NOTE — Plan of Care (Signed)
  Problem: Education: Goal: Knowledge of General Education information will improve Description Including pain rating scale, medication(s)/side effects and non-pharmacologic comfort measures Outcome: Progressing   Problem: Clinical Measurements: Goal: Ability to maintain clinical measurements within normal limits will improve Outcome: Progressing Goal: Will remain free from infection Outcome: Progressing   Problem: Nutrition: Goal: Adequate nutrition will be maintained Outcome: Progressing   Problem: Safety: Goal: Ability to remain free from injury will improve Outcome: Progressing   

## 2024-06-07 NOTE — Progress Notes (Signed)
 Orthopaedic Progress Note  S: Patient seen sitting up in bed.  She has no complaint.  Her block is still in effect.  She has been ambulating with a walker  O:  Vitals:   06/06/24 1854 06/06/24 2110  BP: 120/60 (!) 118/51  Pulse: 86 84  Resp:  18  Temp: 98 F (36.7 C) 98.1 F (36.7 C)  SpO2: 99% 99%    Splint is seen on the right ankle.  No movement of the toes.  Minimal if any sensation in the toes.  Good capillary refill    Labs:  Results for orders placed or performed during the hospital encounter of 06/04/24 (from the past 24 hours)  Basic metabolic panel     Status: Abnormal   Collection Time: 06/07/24  3:32 AM  Result Value Ref Range   Sodium 141 135 - 145 mmol/L   Potassium 3.8 3.5 - 5.1 mmol/L   Chloride 107 98 - 111 mmol/L   CO2 25 22 - 32 mmol/L   Glucose, Bld 109 (H) 70 - 99 mg/dL   BUN 8 8 - 23 mg/dL   Creatinine, Ser 9.50 0.44 - 1.00 mg/dL   Calcium 8.7 (L) 8.9 - 10.3 mg/dL   GFR, Estimated >39 >39 mL/min   Anion gap 9 5 - 15  CBC     Status: Abnormal   Collection Time: 06/07/24  3:32 AM  Result Value Ref Range   WBC 12.4 (H) 4.0 - 10.5 K/uL   RBC 3.57 (L) 3.87 - 5.11 MIL/uL   Hemoglobin 10.8 (L) 12.0 - 15.0 g/dL   HCT 65.6 (L) 63.9 - 53.9 %   MCV 96.1 80.0 - 100.0 fL   MCH 30.3 26.0 - 34.0 pg   MCHC 31.5 30.0 - 36.0 g/dL   RDW 86.2 88.4 - 84.4 %   Platelets 194 150 - 400 K/uL   nRBC 0.0 0.0 - 0.2 %    Assessment: Status post ORIF right ankle, postop day 1  The patient is doing well.  Her block is still affected and so she still has limited movement and sensation.  She has been able to mobilize.  Will have her continue work with physical therapy.  If she clears therapy, can was discharged to home later on today.  She should follow-up with me in 2 weeks time.  Injuries: Right by mall ankle fracture  Weightbearing: Nonweightbearing right leg  Insicional and dressing care: Keep splint in place    Pain management: Percocet  VTE prophylaxis:  Aspirin    Dispo: Home today  Follow - up plan: 2 weeks   Cordella Rhein, MD, MS [facility] [phone]

## 2024-06-07 NOTE — Evaluation (Signed)
 Physical Therapy Evaluation Patient Details Name: Michele Ross MRN: [id number] DOB: [birth year] Today's Date: 06/07/2024  History of Present Illness  Pt s/p fall with R ankle fx and now s/p ORIF.  Pt with hx of osteoporosis  Clinical Impression  Pt admitted as above and presenting with functional mobility limitations 2* decreased R LE strength/ROM, post op pain and NWB on R LE.  Pt motivated and should progress well to dc home with family assist.        If plan is discharge home, recommend the following: A little help with walking and/or transfers;A little help with bathing/dressing/bathroom;Assistance with cooking/housework;Help with stairs or ramp for entrance;Assist for transportation   Can travel by private vehicle        Equipment Recommendations Rolling walker (2 wheels);BSC/3in1  Recommendations for Other Services       Functional Status Assessment Patient has had a recent decline in their functional status and demonstrates the ability to make significant improvements in function in a reasonable and predictable amount of time.     Precautions / Restrictions Precautions Precautions: Fall Restrictions Weight Bearing Restrictions Per Provider Order: Yes RLE Weight Bearing Per Provider Order: Non weight bearing      Mobility  Bed Mobility Overal bed mobility: Modified Independent                  Transfers Overall transfer level: Needs assistance Equipment used: Rolling walker (2 wheels) Transfers: Sit to/from Stand Sit to Stand: Contact guard assist           General transfer comment: min steady assist with cues for LE management and use of UEs to self assist    Ambulation/Gait Ambulation/Gait assistance: Contact guard assist Gait Distance (Feet): 65 Feet Assistive device: Rolling walker (2 wheels) Gait Pattern/deviations: Step-to pattern, Decreased step length - right, Decreased step length - left, Shuffle, Trunk flexed Gait velocity: cues to  slow pace for stability     General Gait Details: cues for posture, position from RW, and to slow for safety  Stairs            Wheelchair Mobility     Tilt Bed    Modified Rankin (Stroke Patients Only)       Balance Overall balance assessment: Needs assistance Sitting-balance support: No upper extremity supported, Feet supported Sitting balance-Leahy Scale: Good     Standing balance support: Bilateral upper extremity supported Standing balance-Leahy Scale: Poor                               Pertinent Vitals/Pain Pain Assessment Pain Assessment: 0-10 Pain Score: 2  Pain Location: R ankle Pain Descriptors / Indicators: Aching Pain Intervention(s): Limited activity within patient's tolerance, Monitored during session, Premedicated before session    Home Living Family/patient expects to be discharged to:: Private residence Living Arrangements: Spouse/significant other Available Help at Discharge: Family;Available 24 hours/day Type of Home: House Home Access: Stairs to enter Entrance Stairs-Rails: None Entrance Stairs-Number of Steps: 1+1   Home Layout: One level Home Equipment: None      Prior Function Prior Level of Function : Independent/Modified Independent                     Extremity/Trunk Assessment   Upper Extremity Assessment Upper Extremity Assessment: Overall WFL for tasks assessed    Lower Extremity Assessment Lower Extremity Assessment: RLE deficits/detail RLE Deficits / Details: splint in place at ankle; ROM/strngth  intact hip and knee    Cervical / Trunk Assessment Cervical / Trunk Assessment: Normal  Communication   Communication Communication: No apparent difficulties    Cognition Arousal: Alert Behavior During Therapy: WFL for tasks assessed/performed   PT - Cognitive impairments: No apparent impairments                         Following commands: Intact       Cueing Cueing Techniques:  Verbal cues     General Comments      Exercises     Assessment/Plan    PT Assessment Patient needs continued PT services  PT Problem List Decreased activity tolerance;Decreased balance;Decreased mobility;Decreased knowledge of use of DME;Pain       PT Treatment Interventions DME instruction;Gait training;Stair training;Functional mobility training;Therapeutic activities;Therapeutic exercise;Balance training;Patient/family education    PT Goals (Current goals can be found in the Care Plan section)  Acute Rehab PT Goals Patient Stated Goal: Regain IND PT Goal Formulation: With patient Time For Goal Achievement: 06/14/24 Potential to Achieve Goals: Good    Frequency 7X/week     Co-evaluation               AM-PAC PT 6 Clicks Mobility  Outcome Measure Help needed turning from your back to your side while in a flat bed without using bedrails?: None Help needed moving from lying on your back to sitting on the side of a flat bed without using bedrails?: None Help needed moving to and from a bed to a chair (including a wheelchair)?: A Little Help needed standing up from a chair using your arms (e.g., wheelchair or bedside chair)?: A Little Help needed to walk in hospital room?: A Little Help needed climbing 3-5 steps with a railing? : A Lot 6 Click Score: 19    End of Session Equipment Utilized During Treatment: Gait belt Activity Tolerance: Patient tolerated treatment well Patient left: in chair;with call bell/phone within reach;with chair alarm set Nurse Communication: Mobility status PT Visit Diagnosis: Unsteadiness on feet (R26.81);Difficulty in walking, not elsewhere classified (R26.2)    Time: 9073-9050 PT Time Calculation (min) (ACUTE ONLY): 23 min   Charges:   PT Evaluation $PT Eval Low Complexity: 1 Low   PT General Charges $$ ACUTE PT VISIT: 1 Visit         [facility] PT Acute Rehabilitation Services Office  [phone]   Levie Wages 06/07/2024, 12:18 PM

## 2024-06-09 ENCOUNTER — Encounter (HOSPITAL_COMMUNITY): Payer: Self-pay

## 2024-12-03 ENCOUNTER — Other Ambulatory Visit (HOSPITAL_BASED_OUTPATIENT_CLINIC_OR_DEPARTMENT_OTHER)
